# Patient Record
Sex: Female | Born: 1975 | Race: Black or African American | Hispanic: No | Marital: Married | Smoking: Never smoker
Health system: Southern US, Community
[De-identification: ages and names within clinical notes are randomized; demographics above are authoritative.]

## PROBLEM LIST (undated history)

## (undated) DIAGNOSIS — G43909 Migraine, unspecified, not intractable, without status migrainosus: Secondary | ICD-10-CM

## (undated) HISTORY — DX: Migraine, unspecified, not intractable, without status migrainosus: G43.909

## (undated) HISTORY — PX: COLONOSCOPY: SHX174

---

## 1998-05-08 ENCOUNTER — Other Ambulatory Visit: Admission: RE | Admit: 1998-05-08 | Discharge: 1998-05-08 | Payer: Self-pay | Admitting: Obstetrics & Gynecology

## 1999-02-19 ENCOUNTER — Emergency Department (HOSPITAL_COMMUNITY): Admission: EM | Admit: 1999-02-19 | Discharge: 1999-02-19 | Payer: Self-pay | Admitting: Emergency Medicine

## 1999-02-20 ENCOUNTER — Encounter: Payer: Self-pay | Admitting: Emergency Medicine

## 1999-08-01 ENCOUNTER — Encounter: Payer: Self-pay | Admitting: Internal Medicine

## 1999-08-01 ENCOUNTER — Ambulatory Visit (HOSPITAL_COMMUNITY): Admission: RE | Admit: 1999-08-01 | Discharge: 1999-08-01 | Payer: Self-pay | Admitting: Internal Medicine

## 1999-09-14 ENCOUNTER — Other Ambulatory Visit: Admission: RE | Admit: 1999-09-14 | Discharge: 1999-09-14 | Payer: Self-pay | Admitting: Obstetrics & Gynecology

## 2000-09-29 ENCOUNTER — Other Ambulatory Visit: Admission: RE | Admit: 2000-09-29 | Discharge: 2000-09-29 | Payer: Self-pay | Admitting: Obstetrics & Gynecology

## 2001-12-25 ENCOUNTER — Other Ambulatory Visit: Admission: RE | Admit: 2001-12-25 | Discharge: 2001-12-25 | Payer: Self-pay | Admitting: Obstetrics & Gynecology

## 2003-01-14 ENCOUNTER — Other Ambulatory Visit: Admission: RE | Admit: 2003-01-14 | Discharge: 2003-01-14 | Payer: Self-pay | Admitting: Obstetrics & Gynecology

## 2004-01-16 ENCOUNTER — Other Ambulatory Visit: Admission: RE | Admit: 2004-01-16 | Discharge: 2004-01-16 | Payer: Self-pay | Admitting: Obstetrics & Gynecology

## 2004-04-21 ENCOUNTER — Ambulatory Visit: Payer: Self-pay | Admitting: Internal Medicine

## 2004-04-27 ENCOUNTER — Ambulatory Visit: Payer: Self-pay | Admitting: Internal Medicine

## 2004-06-09 ENCOUNTER — Inpatient Hospital Stay (HOSPITAL_COMMUNITY): Admission: AD | Admit: 2004-06-09 | Discharge: 2004-06-09 | Payer: Self-pay | Admitting: Obstetrics and Gynecology

## 2004-06-12 ENCOUNTER — Inpatient Hospital Stay (HOSPITAL_COMMUNITY): Admission: AD | Admit: 2004-06-12 | Discharge: 2004-06-12 | Payer: Self-pay | Admitting: Obstetrics & Gynecology

## 2004-06-17 ENCOUNTER — Inpatient Hospital Stay (HOSPITAL_COMMUNITY): Admission: AD | Admit: 2004-06-17 | Discharge: 2004-06-17 | Payer: Self-pay | Admitting: Obstetrics & Gynecology

## 2004-08-24 ENCOUNTER — Ambulatory Visit: Payer: Self-pay | Admitting: Internal Medicine

## 2004-08-31 ENCOUNTER — Ambulatory Visit: Payer: Self-pay | Admitting: Internal Medicine

## 2005-07-14 ENCOUNTER — Inpatient Hospital Stay (HOSPITAL_COMMUNITY): Admission: AD | Admit: 2005-07-14 | Discharge: 2005-07-17 | Payer: Self-pay | Admitting: Obstetrics & Gynecology

## 2006-06-22 ENCOUNTER — Ambulatory Visit: Payer: Self-pay | Admitting: Endocrinology

## 2007-08-10 ENCOUNTER — Ambulatory Visit: Payer: Self-pay | Admitting: Internal Medicine

## 2007-08-10 LAB — CONVERTED CEMR LAB
ALT: 7 units/L (ref 0–35)
AST: 18 units/L (ref 0–37)
Albumin: 4 g/dL (ref 3.5–5.2)
Alkaline Phosphatase: 56 units/L (ref 39–117)
BUN: 13 mg/dL (ref 6–23)
Basophils Absolute: 0.1 10*3/uL (ref 0.0–0.1)
Basophils Relative: 0.9 % (ref 0.0–3.0)
Bilirubin Urine: NEGATIVE
Bilirubin, Direct: 0.2 mg/dL (ref 0.0–0.3)
CO2: 28 meq/L (ref 19–32)
Calcium: 9.3 mg/dL (ref 8.4–10.5)
Chloride: 106 meq/L (ref 96–112)
Cholesterol: 138 mg/dL (ref 0–200)
Creatinine, Ser: 0.8 mg/dL (ref 0.4–1.2)
Eosinophils Absolute: 0.2 10*3/uL (ref 0.0–0.7)
Eosinophils Relative: 2.5 % (ref 0.0–5.0)
GFR calc Af Amer: 106 mL/min
GFR calc non Af Amer: 88 mL/min
Glucose, Bld: 86 mg/dL (ref 70–99)
HCT: 38.6 % (ref 36.0–46.0)
HDL: 39.6 mg/dL (ref >39.0)
Hemoglobin: 13.5 g/dL (ref 12.0–15.0)
Ketones, ur: NEGATIVE mg/dL
LDL Cholesterol: 94 mg/dL (ref 0–99)
Leukocytes, UA: NEGATIVE
Lymphocytes Relative: 29.9 % (ref 12.0–46.0)
MCHC: 34.9 g/dL (ref 30.0–36.0)
MCV: 96 fL (ref 78.0–100.0)
Monocytes Absolute: 0.5 10*3/uL (ref 0.1–1.0)
Monocytes Relative: 7.3 % (ref 3.0–12.0)
Neutro Abs: 3.8 10*3/uL (ref 1.4–7.7)
Neutrophils Relative %: 59.4 % (ref 43.0–77.0)
Nitrite: NEGATIVE
Platelets: 187 10*3/uL (ref 150–400)
Potassium: 4.1 meq/L (ref 3.5–5.1)
RBC: 4.02 M/uL (ref 3.87–5.11)
RDW: 12.2 % (ref 11.5–14.6)
Sodium: 138 meq/L (ref 135–145)
Specific Gravity, Urine: 1.01 (ref 1.000–1.03)
TSH: 1.24 microintl units/mL (ref 0.35–5.50)
Total Bilirubin: 0.9 mg/dL (ref 0.3–1.2)
Total CHOL/HDL Ratio: 3.5
Total Protein, Urine: NEGATIVE mg/dL
Total Protein: 6.8 g/dL (ref 6.0–8.3)
Triglycerides: 22 mg/dL (ref 0–149)
Urine Glucose: NEGATIVE mg/dL
Urobilinogen, UA: 0.2 (ref 0.0–1.0)
VLDL: 4 mg/dL (ref 0–40)
WBC: 6.6 10*3/uL (ref 4.5–10.5)
pH: 7 (ref 5.0–8.0)

## 2007-08-15 ENCOUNTER — Ambulatory Visit: Payer: Self-pay | Admitting: Internal Medicine

## 2007-12-03 ENCOUNTER — Ambulatory Visit: Payer: Self-pay | Admitting: Internal Medicine

## 2007-12-03 DIAGNOSIS — J209 Acute bronchitis, unspecified: Secondary | ICD-10-CM | POA: Insufficient documentation

## 2007-12-15 ENCOUNTER — Ambulatory Visit: Payer: Self-pay | Admitting: Family Medicine

## 2008-05-15 ENCOUNTER — Ambulatory Visit: Payer: Self-pay | Admitting: Endocrinology

## 2008-05-15 DIAGNOSIS — J069 Acute upper respiratory infection, unspecified: Secondary | ICD-10-CM | POA: Insufficient documentation

## 2010-01-21 ENCOUNTER — Ambulatory Visit
Admission: RE | Admit: 2010-01-21 | Discharge: 2010-01-21 | Payer: Self-pay | Source: Home / Self Care | Attending: Internal Medicine | Admitting: Internal Medicine

## 2010-01-21 ENCOUNTER — Other Ambulatory Visit: Payer: Self-pay | Admitting: Internal Medicine

## 2010-01-21 LAB — BASIC METABOLIC PANEL
BUN: 12 mg/dL (ref 6–23)
CO2: 26 mEq/L (ref 19–32)
Calcium: 8.9 mg/dL (ref 8.4–10.5)
Chloride: 104 mEq/L (ref 96–112)
Creatinine, Ser: 0.7 mg/dL (ref 0.4–1.2)
GFR: 132.59 mL/min (ref >60.00)
Glucose, Bld: 79 mg/dL (ref 70–99)
Potassium: 4.1 mEq/L (ref 3.5–5.1)
Sodium: 136 mEq/L (ref 135–145)

## 2010-01-21 LAB — CBC WITH DIFFERENTIAL/PLATELET
Basophils Absolute: 0 10*3/uL (ref 0.0–0.1)
Basophils Relative: 0.3 % (ref 0.0–3.0)
Eosinophils Absolute: 0.1 10*3/uL (ref 0.0–0.7)
Eosinophils Relative: 2.1 % (ref 0.0–5.0)
HCT: 37.6 % (ref 36.0–46.0)
Hemoglobin: 13 g/dL (ref 12.0–15.0)
Lymphocytes Relative: 24.1 % (ref 12.0–46.0)
Lymphs Abs: 1.4 10*3/uL (ref 0.7–4.0)
MCHC: 34.6 g/dL (ref 30.0–36.0)
MCV: 95.4 fl (ref 78.0–100.0)
Monocytes Absolute: 0.5 10*3/uL (ref 0.1–1.0)
Monocytes Relative: 8.1 % (ref 3.0–12.0)
Neutro Abs: 3.8 10*3/uL (ref 1.4–7.7)
Neutrophils Relative %: 65.4 % (ref 43.0–77.0)
Platelets: 183 10*3/uL (ref 150.0–400.0)
RBC: 3.94 Mil/uL (ref 3.87–5.11)
RDW: 13.3 % (ref 11.5–14.6)
WBC: 5.8 10*3/uL (ref 4.5–10.5)

## 2010-01-21 LAB — URINALYSIS, ROUTINE W REFLEX MICROSCOPIC
Bilirubin Urine: NEGATIVE
Ketones, ur: NEGATIVE
Leukocytes, UA: NEGATIVE
Nitrite: NEGATIVE
Specific Gravity, Urine: >=1.03 (ref 1.000–1.030)
Total Protein, Urine: NEGATIVE
Urine Glucose: NEGATIVE
Urobilinogen, UA: 1 (ref 0.0–1.0)
pH: 6 (ref 5.0–8.0)

## 2010-01-21 LAB — HEPATIC FUNCTION PANEL
ALT: 8 U/L (ref 0–35)
AST: 17 U/L (ref 0–37)
Albumin: 3.7 g/dL (ref 3.5–5.2)
Alkaline Phosphatase: 58 U/L (ref 39–117)
Bilirubin, Direct: 0.1 mg/dL (ref 0.0–0.3)
Total Bilirubin: 0.9 mg/dL (ref 0.3–1.2)
Total Protein: 6.9 g/dL (ref 6.0–8.3)

## 2010-01-21 LAB — LIPID PANEL
Cholesterol: 146 mg/dL (ref 0–200)
HDL: 48.4 mg/dL (ref >39.00)
LDL Cholesterol: 92 mg/dL (ref 0–99)
Total CHOL/HDL Ratio: 3
Triglycerides: 26 mg/dL (ref 0.0–149.0)
VLDL: 5.2 mg/dL (ref 0.0–40.0)

## 2010-01-21 LAB — TSH: TSH: 1.45 u[IU]/mL (ref 0.35–5.50)

## 2010-01-29 ENCOUNTER — Ambulatory Visit
Admission: RE | Admit: 2010-01-29 | Discharge: 2010-01-29 | Payer: Self-pay | Source: Home / Self Care | Attending: Internal Medicine | Admitting: Internal Medicine

## 2010-02-11 NOTE — Assessment & Plan Note (Signed)
Summary: PER PT PHYSICAL--[facility]   Vital Signs:  Patient profile:   35 year old female Height:      60 inches Weight:      142 pounds BMI:     27.83 Temp:     98.7 degrees F oral Pulse rate:   84 / minute Pulse rhythm:   regular Resp:     16 per minute BP sitting:   104 / 70  (left arm) Cuff size:   regular  Vitals Entered By: Lanier Prude, CMA(AAMA) (January 29, 2010 2:23 PM) CC: CPX Is Patient Diabetic? No   CC:  CPX.  History of Present Illness: The patient presents for a preventive health examination   Current Medications (verified): 1)  Camila 0.35 Mg  Tabs (Norethindrone (Contraceptive)) .... As Dirr.  Allergies (verified): 1)  ! 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  The patient denies anorexia, fever, weight loss, vision loss, decreased hearing, hoarseness, chest pain, syncope, dyspnea on exertion, peripheral edema, prolonged cough, headaches, hemoptysis, abdominal pain, melena, hematochezia, severe indigestion/heartburn, hematuria, incontinence, genital sores, muscle weakness, suspicious skin lesions, transient blindness, difficulty walking, depression, unusual weight change, abnormal bleeding, enlarged lymph nodes, angioedema, and breast masses.    Physical Exam  General:  Well developed, well nourished, in no acute distress.  Head:  Normocephalic and atraumatic without obvious abnormalities. No apparent alopecia or balding. Eyes:  No corneal or conjunctival inflammation  noted. EOMI. Perrla.  Ears:  External ear exam shows no significant lesions or deformities.  Otoscopic examination reveals clear canals, tympanic membranes are intact bilaterally without bulging, retraction, inflammation or discharge. Hearing is grossly normal bilaterally. Nose:  External nasal examination shows no deformity or inflammation. Nasal mucosa are pink and moist without lesions or exudates. Mouth:  Oral mucosa and oropharynx without lesions or exudates.  Teeth in good repair. Min clear PND. Neck:  No deformities, masses, or tenderness noted. Lungs:  Normal respiratory effort, chest expands symmetrically. Lungs are clear to auscultation, no crackles or wheezes. Heart:  Normal rate and regular rhythm. S1 and S2 normal without gallop, murmur, click, rub or other extra sounds. Abdomen:  Bowel sounds positive,abdomen soft and non-tender without masses, organomegaly or hernias noted. Msk:  No deformity or scoliosis noted of thoracic or lumbar spine.   Extremities:  No clubbing, cyanosis, edema, or deformity noted with normal full range of motion of all joints.   Neurologic:  No cranial nerve deficits noted. Station and gait are normal. Plantar reflexes are down-going bilaterally. DTRs are symmetrical throughout. Sensory, motor and coordinative functions appear intact. Skin:  Intact without suspicious lesions or rashes Cervical Nodes:  No lymphadenopathy noted Inguinal Nodes:  No significant adenopathy Psych:  Cognition and judgment appear intact. Alert and cooperative with normal attention span and concentration. No apparent delusions, illusions, hallucinations   Impression & Recommendations:  Problem # 1:  WELL ADULT EXAM (ICD-V70.0) Assessment New The labs were reviewed with the patient.  Health and age related issues were discussed. Available screening tests and vaccinations were discussed as well. Healthy life style including good  diet and exercise was discussed.  GYN q 12 months    Complete Medication List: 1)  Camila 0.35 Mg Tabs (Norethindrone (contraceptive)) .... As dirr. 2)  Vitamin D 1000 Unit Tabs (Cholecalciferol) .Marland Kitchen.. 1 by mouth qd  Other Orders: Tdap => 67yrs IM (10272) Admin 1st Vaccine (53664)  Patient Instructions: 1)  Please schedule a follow-up appointment in 1 year well w/labs.   Orders Added: 1)  Tdap => 56yrs IM [90715] 2)  Admin 1st Vaccine [90471] 3)  Est. Patient age 41-39 [86]   Immunizations Administered:  Tetanus Vaccine:    Vaccine Type: Tdap    Site: left deltoid    Mfr: GlaxoSmithKline    Dose: 0.5 ml    Route: IM    Given by: Lanier Prude, CMA(AAMA)    Exp. Date: 10/30/2011    Lot #: [id number]    VIS given: 11/28/07 version given January 29, 2010.   Immunizations Administered:  Tetanus Vaccine:    Vaccine Type: Tdap    Site: left deltoid    Mfr: GlaxoSmithKline    Dose: 0.5 ml    Route: IM    Given by: Lanier Prude, CMA(AAMA)    Exp. Date: 10/30/2011    Lot #: [id number]    VIS given: 11/28/07 version given January 29, 2010.

## 2010-05-12 ENCOUNTER — Ambulatory Visit (INDEPENDENT_AMBULATORY_CARE_PROVIDER_SITE_OTHER): Payer: BC Managed Care – PPO | Admitting: Internal Medicine

## 2010-05-12 ENCOUNTER — Other Ambulatory Visit: Payer: Self-pay | Admitting: Internal Medicine

## 2010-05-12 ENCOUNTER — Other Ambulatory Visit (INDEPENDENT_AMBULATORY_CARE_PROVIDER_SITE_OTHER): Payer: BC Managed Care – PPO

## 2010-05-12 ENCOUNTER — Encounter: Payer: Self-pay | Admitting: Internal Medicine

## 2010-05-12 VITALS — BP 104/64 | HR 65 | Temp 98.6°F | Resp 16 | Wt 146.8 lb

## 2010-05-12 DIAGNOSIS — N390 Urinary tract infection, site not specified: Secondary | ICD-10-CM

## 2010-05-12 DIAGNOSIS — R109 Unspecified abdominal pain: Secondary | ICD-10-CM

## 2010-05-12 DIAGNOSIS — R3129 Other microscopic hematuria: Secondary | ICD-10-CM | POA: Insufficient documentation

## 2010-05-12 DIAGNOSIS — N200 Calculus of kidney: Secondary | ICD-10-CM

## 2010-05-12 LAB — CBC WITH DIFFERENTIAL/PLATELET
Basophils Relative: 0.2 % (ref 0.0–3.0)
Eosinophils Absolute: 0.2 10*3/uL (ref 0.0–0.7)
Eosinophils Relative: 2 % (ref 0.0–5.0)
HCT: 40.1 % (ref 36.0–46.0)
Lymphs Abs: 2.1 10*3/uL (ref 0.7–4.0)
MCHC: 34.8 g/dL (ref 30.0–36.0)
MCV: 95.4 fl (ref 78.0–100.0)
Monocytes Absolute: 0.2 10*3/uL (ref 0.1–1.0)
RBC: 4.2 Mil/uL (ref 3.87–5.11)
WBC: 8.6 10*3/uL (ref 4.5–10.5)

## 2010-05-12 LAB — COMPREHENSIVE METABOLIC PANEL
AST: 20 U/L (ref 0–37)
Alkaline Phosphatase: 66 U/L (ref 39–117)
BUN: 14 mg/dL (ref 6–23)
Creatinine, Ser: 0.7 mg/dL (ref 0.4–1.2)
Glucose, Bld: 81 mg/dL (ref 70–99)
Total Bilirubin: 0.5 mg/dL (ref 0.3–1.2)

## 2010-05-12 LAB — PREGNANCY SERUM, QUANT: hCG, Beta Chain, Quant, S: 0.17 m[IU]/mL

## 2010-05-12 LAB — POCT URINALYSIS DIPSTICK
Glucose, UA: NEGATIVE
Ketones, UA: NEGATIVE
Protein, UA: NEGATIVE
Spec Grav, UA: 1.005

## 2010-05-12 MED ORDER — NITROFURANTOIN MONOHYD MACRO 100 MG PO CAPS: 100.0000 mg | ORAL_CAPSULE | Freq: Two times a day (BID) | ORAL | Status: AC

## 2010-05-12 NOTE — Progress Notes (Signed)
Subjective:    Patient ID: Leslie Beard, female    DOB: 1975-03-08, 35 y.o.   MRN: [id number]  Flank Pain This is a new problem. The current episode started yesterday. The problem occurs intermittently. The problem has been gradually improving since onset. The quality of the pain is described as aching. The pain does not radiate. The pain is at a severity of 2/10. The pain is mild. The pain is the same all the time. Associated symptoms include abdominal pain and dysuria. Pertinent negatives include no bladder incontinence, bowel incontinence, chest pain, fever, headaches, leg pain, numbness, paresis, paresthesias, pelvic pain, perianal numbness, tingling, weakness or weight loss. She has tried NSAIDs for the symptoms. The treatment provided significant relief.      Review of Systems  Constitutional: Negative for fever, chills, weight loss, diaphoresis, activity change, appetite change, fatigue and unexpected weight change.  HENT: Negative for facial swelling, neck pain and neck stiffness.   Respiratory: Negative for apnea, cough, choking, chest tightness, shortness of breath, wheezing and stridor.   Cardiovascular: Negative for chest pain, palpitations and leg swelling.  Gastrointestinal: Positive for abdominal pain. Negative for nausea, vomiting, diarrhea, constipation, blood in stool, abdominal distention, anal bleeding and bowel incontinence.  Genitourinary: Positive for dysuria and flank pain. Negative for bladder incontinence, urgency, frequency, hematuria, decreased urine volume, vaginal bleeding, vaginal discharge, enuresis, difficulty urinating, genital sores, vaginal pain, menstrual problem, pelvic pain and dyspareunia.  Musculoskeletal: Negative for myalgias, back pain, joint swelling, arthralgias and gait problem.  Skin: Negative for color change, pallor and rash.  Neurological: Negative for dizziness, tingling, tremors, seizures, syncope, facial asymmetry, speech difficulty,  weakness, light-headedness, numbness, headaches and paresthesias.  Hematological: Negative for adenopathy. Does not bruise/bleed easily.  Psychiatric/Behavioral: Negative for suicidal ideas, hallucinations, behavioral problems, confusion, sleep disturbance, self-injury, dysphoric mood, decreased concentration and agitation. The patient is not nervous/anxious and is not hyperactive.        Objective:   Physical Exam  [vitalsreviewed. Constitutional: She is oriented to person, place, and time. She appears well-developed and well-nourished. No distress.  HENT:  Head: Normocephalic and atraumatic.  Right Ear: External ear normal.  Left Ear: External ear normal.  Nose: Nose normal.  Mouth/Throat: Oropharynx is clear and moist. No oropharyngeal exudate.  Eyes: Conjunctivae and EOM are normal. Pupils are equal, round, and reactive to light. Right eye exhibits no discharge. Left eye exhibits no discharge. No scleral icterus.  Neck: Normal range of motion. Neck supple. No JVD present. No tracheal deviation present. No thyromegaly present.  Cardiovascular: Normal rate, regular rhythm, normal heart sounds and intact distal pulses.  Exam reveals no gallop and no friction rub.   No murmur heard. Pulmonary/Chest: Effort normal and breath sounds normal. No stridor. No respiratory distress. She has no wheezes. She has no rales. She exhibits no tenderness.  Abdominal: Soft. Bowel sounds are normal. She exhibits no shifting dullness, no distension, no pulsatile liver, no fluid wave, no abdominal bruit, no ascites, no pulsatile midline mass and no mass. There is no hepatosplenomegaly, splenomegaly or hepatomegaly. There is no tenderness. There is CVA tenderness. There is no rigidity, no rebound, no guarding, no tenderness at McBurney's point and negative Murphy's sign. No hernia. Hernia confirmed negative in the ventral area, confirmed negative in the right inguinal area and confirmed negative in the left inguinal  area.  Musculoskeletal: Normal range of motion. She exhibits no edema and no tenderness.  Lymphadenopathy:    She has no cervical adenopathy.  Neurological: She  is alert and oriented to person, place, and time. She has normal reflexes. She displays normal reflexes. No cranial nerve deficit. She exhibits normal muscle tone. Coordination normal.  Skin: Skin is warm and dry. No rash noted. She is not diaphoretic. No erythema. No pallor.  Psychiatric: She has a normal mood and affect. Her behavior is normal. Judgment and thought content normal.       Lab Results  Component Value Date   WBC 8.6 05/12/2010   HGB 13.9 05/12/2010   HCT 40.1 05/12/2010   PLT 217.0 05/12/2010   CHOL 146 01/21/2010   TRIG 26.0 01/21/2010   HDL 48.40 01/21/2010   ALT 9 05/12/2010   AST 20 05/12/2010   NA 140 05/12/2010   K 3.4* 05/12/2010   CL 102 05/12/2010   CREATININE 0.7 05/12/2010   BUN 14 05/12/2010   CO2 30 05/12/2010   TSH 1.45 01/21/2010     Assessment & Plan:

## 2010-05-12 NOTE — Assessment & Plan Note (Signed)
She has pain and blood so I am concerned she has a stone, she does not want anything for pain

## 2010-05-12 NOTE — Patient Instructions (Signed)
Urinary Tract Infection (UTI) Infections of the urinary tract can start in several places. A bladder infection (cystitis), a kidney infection (pyelonephritis), and a prostate infection (prostatitis) are different types of urinary tract infections. They usually get better if treated with medicines (antibiotics) that kill germs. Take all the medicine until it is gone. You or your child may feel better in a few days, but TAKE ALL MEDICINE or the infection may not respond and may become more difficult to treat. HOME CARE INSTRUCTIONS  Drink enough water and fluids to keep the urine clear or pale yellow. Cranberry juice is especially recommended, in addition to large amounts of water.   Avoid caffeine, tea, and carbonated beverages. They tend to irritate the bladder.   Alcohol may irritate the prostate.   Only take over-the-counter or prescription medicines for pain, discomfort, or fever as directed by your caregiver.  FINDING OUT THE RESULTS OF YOUR TEST Not all test results are available during your visit. If your or your child's test results are not back during the visit, make an appointment with your caregiver to find out the results. Do not assume everything is normal if you have not heard from your caregiver or the medical facility. It is important for you to follow up on all test results. TO PREVENT FURTHER INFECTIONS:  Empty the bladder often. Avoid holding urine for long periods of time.   After a bowel movement, women should cleanse from front to back. Use each tissue only once.   Empty the bladder before and after sexual intercourse.  SEEK MEDICAL CARE IF:  There is back pain.   You or your child has an oral temperature above 100.5.   Your baby is older than 3 months with a rectal temperature of 100.5 F (38.1 C) or higher for more than 1 day.   Your or your child's problems (symptoms) are no better in 3 days. Return sooner if you or your child is getting worse.  SEEK IMMEDIATE  MEDICAL CARE IF:  There is severe back pain or lower abdominal pain.   You or your child develops chills.   You or your child has an oral temperature above 100.5, not controlled by medicine.   Your baby is older than 3 months with a rectal temperature of 102 F (38.9 C) or higher.   Your baby is 3 months old or younger with a rectal temperature of 100.4 F (38 C) or higher.   There is nausea or vomiting.   There is continued burning or discomfort with urination.  MAKE SURE YOU:  Understand these instructions.   Will watch this condition.   Will get help right away if you or your child is not doing well or gets worse.  Document Released: 10/06/2004 Document Re-Released: 03/23/2009 [employer] Patient Information 2011 [employer]. 

## 2010-05-12 NOTE — Assessment & Plan Note (Signed)
Start antibiotics and check the urine culture

## 2010-05-12 NOTE — Assessment & Plan Note (Signed)
Will check for UTI with a urine culture and check for a stone with a CT with contrast

## 2010-05-14 ENCOUNTER — Telehealth: Payer: Self-pay | Admitting: *Deleted

## 2010-05-14 ENCOUNTER — Encounter: Payer: Self-pay | Admitting: Internal Medicine

## 2010-05-14 NOTE — Telephone Encounter (Signed)
Pt called inquiring about recent lab results Dr. Yetta Barre ordered. I advised her of these results and that a letter was mailed today and to have these rechecked in 2 weeks.   She still c/o back pain. I advised her Dr. Yetta Barre and Plotnikov are out of office until Monday and to use OTC meds and go to UC or sat clinic if symptoms worsen during the weekend.  Please advise any different meds for pain?

## 2010-05-15 LAB — CULTURE, URINE COMPREHENSIVE: Colony Count: 15000

## 2010-05-17 MED ORDER — IBUPROFEN 600 MG PO TABS: ORAL_TABLET | ORAL | Status: AC

## 2010-05-17 NOTE — Telephone Encounter (Signed)
Try Ibuprofen OV w/me or Dr Yetta Barre pls

## 2010-05-18 NOTE — Telephone Encounter (Signed)
Pt informed

## 2010-05-21 ENCOUNTER — Ambulatory Visit (INDEPENDENT_AMBULATORY_CARE_PROVIDER_SITE_OTHER)
Admission: RE | Admit: 2010-05-21 | Discharge: 2010-05-21 | Disposition: A | Discharge status: Home / Self Care | Payer: BC Managed Care – PPO | Source: Ambulatory Visit | Attending: Internal Medicine | Admitting: Internal Medicine

## 2010-05-21 DIAGNOSIS — R109 Unspecified abdominal pain: Secondary | ICD-10-CM

## 2010-05-21 DIAGNOSIS — R3129 Other microscopic hematuria: Secondary | ICD-10-CM

## 2010-05-21 DIAGNOSIS — N200 Calculus of kidney: Secondary | ICD-10-CM

## 2010-05-25 ENCOUNTER — Telehealth: Payer: Self-pay | Admitting: *Deleted

## 2010-05-25 NOTE — Telephone Encounter (Signed)
CT was nl - good news! Thx

## 2010-05-25 NOTE — Telephone Encounter (Signed)
Patient requesting results of CT from last week.

## 2010-05-26 NOTE — Telephone Encounter (Signed)
Left mess to call office back.   

## 2010-05-26 NOTE — Telephone Encounter (Signed)
Patient informed. 

## 2010-05-28 NOTE — Discharge Summary (Signed)
NAMEMarland Kitchen  Beard, Leslie NO.:  [id number]   MEDICAL RECORD NO.:  [id number]          PATIENT TYPE:  INP   LOCATION:  9144                          FACILITY:  [facility]   PHYSICIAN:  Miguel Aschoff, M.D.       DATE OF BIRTH:  01/04/1976   DATE OF ADMISSION:  07/14/2005  DATE OF DISCHARGE:  07/17/2005                                 DISCHARGE SUMMARY   FINAL DIAGNOSIS:  Intrauterine pregnancy at term, breech presentation.   PROCEDURE:  Primary low transverse cesarean section. Surgeon Dr. Ilda Mori. Assistant Dr. Carrington Clamp.   COMPLICATIONS:  None.   This 35 year old G3, P1-0-1-1, presents at term for a cesarean section  secondary to breech presentation. The patient's antepartum course up to this  point had been uncomplicated. She did have a positive group B strep culture  obtained in the office at 36 weeks. Besides this breech presentation  her  antepartum course was uncomplicated. She declined external cephalic version.  She presents on July 14, 2005 for a primary low transverse cesarean section  which was performed by Dr. Ilda Mori. She had delivery of a 5 pound 10  ounce female infant with Apgars 9 and 9. Delivery went without  complications. The patient's postoperative course was benign without any  significant fevers. She was felt ready for discharge on postoperative day  #3. She was sent home on a regular diet. Told to decrease activities, told  to continue her prenatal vitamins. Was given Tylox one every three hours as  needed for pain. Was to follow up in our office in five weeks. To call with  any increased bleeding, pain or problems.   LABORATORIES ON DISCHARGE:  The patient had a hemoglobin 10.4, white blood  cell count 10.7, platelets 151,000.      Leilani Able, P.A.-C.      Miguel Aschoff, M.D.  Electronically Signed    MB/MEDQ  D:  08/08/2005  T:  08/08/2005  Job:  [id number]

## 2010-05-28 NOTE — Op Note (Signed)
NAMEMarland Kitchen  CORY, RAMA NO.:  [id number]   MEDICAL RECORD NO.:  [id number]          PATIENT TYPE:  INP   LOCATION:  9144                          FACILITY:  [facility]   PHYSICIAN:  Ilda Mori, M.D.   DATE OF BIRTH:  14-Aug-1975   DATE OF PROCEDURE:  07/14/2005  DATE OF DISCHARGE:                                 OPERATIVE REPORT   PREOPERATIVE DIAGNOSIS:  Term pregnancy, breech presentation.   POSTOPERATIVE DIAGNOSIS:  Term pregnancy, breech presentation.   PROCEDURE:  Primary low transverse cesarean section.   SURGEON:  Ilda Mori, M.D.   ASSISTANT:  Carrington Clamp, M.D.   ANESTHESIA:  Epidural.   ESTIMATED BLOOD LOSS:  500 cc.   FINDINGS:  Female infant.  Apgar scores 9 and 9.  Birth weight pending at  the time of this dictation but appeared to be appropriate for gestational  age.  Clear amniotic fluid.  Normal-appearing tubes and ovaries.   SPECIMENS:  None.  Placenta was sent to labor and delivery for storage.   COMPLICATIONS:  None.   INDICATIONS:  This is a 35 year old gravida 3, para 1, AB 1, with an  estimated date of confinement of July 21, 2005, at 39 weeks, who presents  with a breech presentation.  The patient had a known breech presentation for  the last three weeks in the office.  Discussions with the patient were held  with the option of external cephalic version.  The patient elected to  proceed with cesarean section.  An ultrasound done the morning of surgery  revealed that the breech presentation had persisted.   PROCEDURE:  The patient was taken to the operating room and a spinal  anesthesia was placed.  This failed to produce adequate anesthesia and an  epidural anesthesia was placed successfully.  The abdomen was prepped and  draped in a sterile fashion and the bladder was catheterized.  A low  transverse incision was made and carried down to the fascia which was  extended transversely.  The rectus muscle was divided from the  overlying  rectus sheath and then divided in the midline.  The peritoneum was then  entered sharply and extended bluntly.  The lower segment was identified.  Incision was made and carried down to the amniotic cavity.  This incision  was extended bluntly.  The infant was then delivered in frank breech  presentation without difficulty.  Cord bloods were obtained.  The placenta  was delivered manually.  The uterus was bluntly curettaged.  The lower  segment was closed with a double  layer, the first a running interlocking Vicryl suture, the second a running  imbricating Vicryl suture.  The peritoneum and rectus muscle was then closed  in the midline.  The fascia was closed with running 0 Vicryl suture and the  skin was closed with staples.  The patient tolerated the procedure well and  left the operating room in good condition.      Ilda Mori, M.D.  Electronically Signed     RK/MEDQ  D:  07/14/2005  T:  07/14/2005  Job:  [id number] 

## 2011-01-10 ENCOUNTER — Ambulatory Visit (INDEPENDENT_AMBULATORY_CARE_PROVIDER_SITE_OTHER): Payer: 59 | Admitting: Internal Medicine

## 2011-01-10 ENCOUNTER — Encounter: Payer: Self-pay | Admitting: Internal Medicine

## 2011-01-10 VITALS — BP 104/60 | HR 72 | Temp 98.7°F | Resp 16 | Wt 141.0 lb

## 2011-01-10 DIAGNOSIS — G43909 Migraine, unspecified, not intractable, without status migrainosus: Secondary | ICD-10-CM | POA: Insufficient documentation

## 2011-01-10 DIAGNOSIS — R51 Headache: Secondary | ICD-10-CM

## 2011-01-10 MED ORDER — TRAMADOL HCL 50 MG PO TABS: 50.0000 mg | ORAL_TABLET | Freq: Two times a day (BID) | ORAL | Status: AC | PRN

## 2011-01-10 MED ORDER — RIZATRIPTAN BENZOATE 10 MG PO TBDP: 10.0000 mg | ORAL_TABLET | ORAL | Status: DC | PRN

## 2011-01-10 MED ORDER — PROMETHAZINE HCL 12.5 MG PO TABS: 12.5000 mg | ORAL_TABLET | Freq: Four times a day (QID) | ORAL | Status: AC | PRN

## 2011-01-10 MED ORDER — PREDNISONE 10 MG PO TABS: ORAL_TABLET | ORAL | Status: DC

## 2011-01-10 NOTE — Assessment & Plan Note (Addendum)
12/12  X 1 wk She had been to [facility]. She had an MRI 3 y ago See meds/orders

## 2011-01-21 ENCOUNTER — Ambulatory Visit (INDEPENDENT_AMBULATORY_CARE_PROVIDER_SITE_OTHER): Payer: 59 | Admitting: Internal Medicine

## 2011-01-21 ENCOUNTER — Encounter: Payer: Self-pay | Admitting: Internal Medicine

## 2011-01-21 VITALS — BP 120/80 | HR 80 | Temp 99.7°F | Resp 16 | Wt 139.0 lb

## 2011-01-21 DIAGNOSIS — J069 Acute upper respiratory infection, unspecified: Secondary | ICD-10-CM

## 2011-01-21 MED ORDER — PROMETHAZINE-CODEINE 6.25-10 MG/5ML PO SYRP: 5.0000 mL | ORAL_SOLUTION | ORAL | Status: AC | PRN

## 2011-01-21 MED ORDER — AZITHROMYCIN 250 MG PO TABS: ORAL_TABLET | ORAL | Status: AC

## 2011-01-21 MED ORDER — AZITHROMYCIN 250 MG PO TABS: ORAL_TABLET | ORAL | Status: DC

## 2011-01-21 NOTE — Assessment & Plan Note (Signed)
Prom-cod syr OTC meds Zpac if worse

## 2011-01-21 NOTE — Progress Notes (Signed)
  Subjective:    Patient ID: Leslie Beard, female    DOB: 08-24-1975, 36 y.o.   MRN: [id number]  HPI   HPI  C/o URI sx's x 3  days. C/o ST, cough, weakness. Not better with OTC medicines. Actually, the patient is getting worse. The patient did not sleep last night due to cough.  Review of Systems  Constitutional: Positive for fever, chills and fatigue.  HENT: Positive for congestion, rhinorrhea, sneezing and postnasal drip.   Eyes: Positive for photophobia and pain. Negative for discharge and visual disturbance.  Respiratory: Positive for cough   Gastrointestinal: Negative for vomiting, abdominal pain, diarrhea and abdominal distention.  Genitourinary: Negative for dysuria and difficulty urinating.  Skin: Negative for rash.       Review of Systems     Objective:   Physical Exam  Constitutional: She appears well-developed. No distress.  HENT:  Head: Normocephalic.  Right Ear: External ear normal.  Left Ear: External ear normal.  Nose: Nose normal.  Mouth/Throat: Oropharynx is clear and moist.       eryth mucosa  Eyes: Conjunctivae are normal. Pupils are equal, round, and reactive to light. Right eye exhibits no discharge. Left eye exhibits no discharge.  Neck: Normal range of motion. Neck supple. No JVD present. No tracheal deviation present. No thyromegaly present.  Cardiovascular: Normal rate, regular rhythm and normal heart sounds.   Pulmonary/Chest: No stridor. No respiratory distress. She has no wheezes.  Abdominal: Soft. Bowel sounds are normal. She exhibits no distension and no mass. There is no tenderness. There is no rebound and no guarding.  Musculoskeletal: She exhibits no edema and no tenderness.  Lymphadenopathy:    She has no cervical adenopathy.  Neurological: She displays normal reflexes. No cranial nerve deficit. She exhibits normal muscle tone. Coordination normal.  Skin: No rash noted. No erythema.  Psychiatric: She has a normal mood and affect. Her  behavior is normal. Judgment and thought content normal.          Assessment & Plan:

## 2011-01-21 NOTE — Patient Instructions (Signed)

## 2011-03-27 ENCOUNTER — Encounter: Payer: Self-pay | Admitting: Internal Medicine

## 2011-03-27 NOTE — Progress Notes (Signed)
  Subjective:    Patient ID: Leslie Beard, female    DOB: 15-Aug-1975, 36 y.o.   MRN: [id number]  HPI  C/o HA x 1 wk, moderate No n/v  Review of Systems  Constitutional: Negative for fever and fatigue.  Respiratory: Negative for cough.   Gastrointestinal: Negative for abdominal pain.  Neurological: Positive for headaches. Negative for dizziness and weakness.  Psychiatric/Behavioral: The patient is not nervous/anxious.        Objective:   Physical Exam  Constitutional: She appears well-developed. No distress.  HENT:  Head: Normocephalic.  Right Ear: External ear normal.  Left Ear: External ear normal.  Nose: Nose normal.  Mouth/Throat: Oropharynx is clear and moist.  Eyes: Conjunctivae are normal. Pupils are equal, round, and reactive to light. Right eye exhibits no discharge. Left eye exhibits no discharge.  Neck: Normal range of motion. Neck supple. No JVD present. No tracheal deviation present. No thyromegaly present.  Cardiovascular: Normal rate, regular rhythm and normal heart sounds.   Pulmonary/Chest: No stridor. No respiratory distress. She has no wheezes.  Abdominal: Soft. Bowel sounds are normal. She exhibits no distension and no mass. There is no tenderness. There is no rebound and no guarding.  Musculoskeletal: She exhibits no edema and no tenderness.  Lymphadenopathy:    She has no cervical adenopathy.  Neurological: She displays normal reflexes. No cranial nerve deficit. She exhibits normal muscle tone. Coordination normal.  Skin: No rash noted. No erythema.  Psychiatric: She has a normal mood and affect. Her behavior is normal. Judgment and thought content normal.          Assessment & Plan:

## 2011-06-14 ENCOUNTER — Ambulatory Visit (INDEPENDENT_AMBULATORY_CARE_PROVIDER_SITE_OTHER): Payer: 59 | Admitting: Internal Medicine

## 2011-06-14 ENCOUNTER — Encounter: Payer: Self-pay | Admitting: Internal Medicine

## 2011-06-14 VITALS — BP 100/60 | HR 80 | Temp 98.3°F | Resp 16 | Wt 142.0 lb

## 2011-06-14 DIAGNOSIS — J069 Acute upper respiratory infection, unspecified: Secondary | ICD-10-CM

## 2011-06-14 MED ORDER — AZITHROMYCIN 250 MG PO TABS: ORAL_TABLET | ORAL | Status: AC

## 2011-06-14 NOTE — Assessment & Plan Note (Signed)
Zpac To work on Fri if ok OTC meds

## 2011-06-14 NOTE — Progress Notes (Signed)
Patient ID: Leslie Beard, female   DOB: 09/01/1975, 36 y.o.   MRN: [id number]  Subjective:    Patient ID: Leslie Beard, female    DOB: 1975-04-13, 36 y.o.   MRN: [id number]  Cough  Sore Throat  Associated symptoms include coughing.     HPI  C/o URI sx's x 1 wk. C/o ST, cough, weakness. Not better with OTC medicines. Actually, the patient is getting worse. The patient did not sleep last night due to cough.  Review of Systems  Constitutional: Positive for fever, chills and fatigue.  HENT: Positive for congestion, rhinorrhea, sneezing and postnasal drip.   Eyes: Positive for photophobia and pain. Negative for discharge and visual disturbance.  Respiratory: Positive for cough   Gastrointestinal: Negative for vomiting, abdominal pain, diarrhea and abdominal distention.  Genitourinary: Negative for dysuria and difficulty urinating.  Skin: Negative for rash.       Review of Systems  Respiratory: Positive for cough.        Objective:   Physical Exam  Constitutional: She appears well-developed. No distress.  HENT:  Head: Normocephalic.  Right Ear: External ear normal.  Left Ear: External ear normal.  Nose: Nose normal.  Mouth/Throat: Oropharynx is clear and moist.       eryth mucosa  Eyes: Conjunctivae are normal. Pupils are equal, round, and reactive to light. Right eye exhibits no discharge. Left eye exhibits no discharge.  Neck: Normal range of motion. Neck supple. No JVD present. No tracheal deviation present. No thyromegaly present.  Cardiovascular: Normal rate, regular rhythm and normal heart sounds.   Pulmonary/Chest: No stridor. No respiratory distress. She has no wheezes.  Abdominal: Soft. Bowel sounds are normal. She exhibits no distension and no mass. There is no tenderness. There is no rebound and no guarding.  Musculoskeletal: She exhibits no edema and no tenderness.  Lymphadenopathy:    She has no cervical adenopathy.  Neurological: She displays normal  reflexes. No cranial nerve deficit. She exhibits normal muscle tone. Coordination normal.  Skin: No rash noted. No erythema.  Psychiatric: She has a normal mood and affect. Her behavior is normal. Judgment and thought content normal.          Assessment & Plan:

## 2011-12-13 ENCOUNTER — Encounter: Payer: Self-pay | Admitting: Internal Medicine

## 2011-12-13 DIAGNOSIS — Z0000 Encounter for general adult medical examination without abnormal findings: Secondary | ICD-10-CM

## 2012-02-01 ENCOUNTER — Encounter: Payer: 59 | Admitting: Internal Medicine

## 2012-02-07 ENCOUNTER — Encounter: Payer: Self-pay | Admitting: Internal Medicine

## 2012-02-07 ENCOUNTER — Ambulatory Visit (INDEPENDENT_AMBULATORY_CARE_PROVIDER_SITE_OTHER): Payer: 59 | Admitting: Internal Medicine

## 2012-02-07 ENCOUNTER — Ambulatory Visit (INDEPENDENT_AMBULATORY_CARE_PROVIDER_SITE_OTHER)
Admission: RE | Admit: 2012-02-07 | Discharge: 2012-02-07 | Disposition: A | Discharge status: Home / Self Care | Payer: 59 | Source: Ambulatory Visit | Attending: Internal Medicine | Admitting: Internal Medicine

## 2012-02-07 ENCOUNTER — Other Ambulatory Visit (INDEPENDENT_AMBULATORY_CARE_PROVIDER_SITE_OTHER): Payer: 59

## 2012-02-07 VITALS — BP 120/80 | HR 60 | Temp 98.5°F | Wt 151.0 lb

## 2012-02-07 DIAGNOSIS — J069 Acute upper respiratory infection, unspecified: Secondary | ICD-10-CM

## 2012-02-07 DIAGNOSIS — R0602 Shortness of breath: Secondary | ICD-10-CM

## 2012-02-07 DIAGNOSIS — R0609 Other forms of dyspnea: Secondary | ICD-10-CM

## 2012-02-07 DIAGNOSIS — J209 Acute bronchitis, unspecified: Secondary | ICD-10-CM

## 2012-02-07 LAB — BASIC METABOLIC PANEL
Chloride: 104 mEq/L (ref 96–112)
GFR: 112.89 mL/min (ref >60.00)
Glucose, Bld: 84 mg/dL (ref 70–99)
Potassium: 3.8 mEq/L (ref 3.5–5.1)
Sodium: 136 mEq/L (ref 135–145)

## 2012-02-07 LAB — CBC WITH DIFFERENTIAL/PLATELET
Eosinophils Relative: 2.2 % (ref 0.0–5.0)
Lymphocytes Relative: 26.3 % (ref 12.0–46.0)
MCV: 93.7 fl (ref 78.0–100.0)
Monocytes Absolute: 0.6 10*3/uL (ref 0.1–1.0)
Monocytes Relative: 6.6 % (ref 3.0–12.0)
Neutrophils Relative %: 64.3 % (ref 43.0–77.0)
Platelets: 218 10*3/uL (ref 150.0–400.0)
WBC: 8.4 10*3/uL (ref 4.5–10.5)

## 2012-02-07 MED ORDER — MOMETASONE FURO-FORMOTEROL FUM 100-5 MCG/ACT IN AERO: 2.0000 | INHALATION_SPRAY | Freq: Two times a day (BID) | RESPIRATORY_TRACT | Status: DC

## 2012-02-07 MED ORDER — PROMETHAZINE-CODEINE 6.25-10 MG/5ML PO SYRP
5.0000 mL | ORAL_SOLUTION | ORAL | Status: AC | PRN
Start: 2012-02-07 — End: 2012-02-17

## 2012-02-07 NOTE — Progress Notes (Signed)
   Subjective:    Shortness of Breath This is a new problem. The current episode started in the past 7 days. The problem occurs intermittently. The problem has been unchanged. Pertinent negatives include no fever or wheezing. The symptoms are aggravated by exercise. Risk factors include oral contraceptive. The treatment provided no relief. There is no history of asthma or CAD.     HPI  C/o URI sx's x 14days. C/o ST, cough, weakness. Not better with OTC medicines. Actually, the patient is getting worse. The patient did not sleep last night due to cough.  Review of Systems  Constitutional: Positive for fever, chills and fatigue.  HENT: Positive for congestion, rhinorrhea, sneezing and postnasal drip.   Eyes: Positive for photophobia and pain. Negative for discharge and visual disturbance.  Respiratory: Positive for cough   Gastrointestinal: Negative for vomiting, abdominal pain, diarrhea and abdominal distention.  Genitourinary: Negative for dysuria and difficulty urinating.  Skin: Negative for rash.       Review of Systems  Constitutional: Negative for fever.  Respiratory: Positive for shortness of breath. Negative for wheezing.        Objective:   Physical Exam  Constitutional: She appears well-developed. No distress.  HENT:  Head: Normocephalic.  Right Ear: External ear normal.  Left Ear: External ear normal.  Nose: Nose normal.  Mouth/Throat: Oropharynx is clear and moist.       eryth mucosa  Eyes: Conjunctivae normal are normal. Pupils are equal, round, and reactive to light. Right eye exhibits no discharge. Left eye exhibits no discharge.  Neck: Normal range of motion. Neck supple. No JVD present. No tracheal deviation present. No thyromegaly present.  Cardiovascular: Normal rate, regular rhythm and normal heart sounds.   Pulmonary/Chest: No stridor. No respiratory distress. She has no wheezes.  Abdominal: Soft. Bowel sounds are normal. She exhibits no distension and  no mass. There is no tenderness. There is no rebound and no guarding.  Musculoskeletal: She exhibits no edema and no tenderness.  Lymphadenopathy:    She has no cervical adenopathy.  Neurological: She displays normal reflexes. No cranial nerve deficit. She exhibits normal muscle tone. Coordination normal.  Skin: No rash noted. No erythema.  Psychiatric: She has a normal mood and affect. Her behavior is normal. Judgment and thought content normal.          Assessment & Plan:

## 2012-02-07 NOTE — Patient Instructions (Addendum)
Go to ER if very short of breath 

## 2012-02-08 ENCOUNTER — Telehealth: Payer: Self-pay | Admitting: Internal Medicine

## 2012-02-08 ENCOUNTER — Encounter: Payer: Self-pay | Admitting: Internal Medicine

## 2012-02-08 DIAGNOSIS — R0609 Other forms of dyspnea: Secondary | ICD-10-CM | POA: Insufficient documentation

## 2012-02-08 LAB — D-DIMER, QUANTITATIVE: D-Dimer, Quant: 0.27 ug/mL-FEU (ref 0.00–0.48)

## 2012-02-08 NOTE — Telephone Encounter (Signed)
Misty Stanley, please, inform patient that all labs and CXR are normal. X - as we discussed Thx

## 2012-02-08 NOTE — Assessment & Plan Note (Signed)
1/14 acute - likely due to asthmatic bronchitis CXR EKG Labs incl d-dimer [facility]

## 2012-02-08 NOTE — Assessment & Plan Note (Signed)
Prom-cod syr prn 

## 2012-02-08 NOTE — Assessment & Plan Note (Signed)
Dulera bid

## 2012-02-08 NOTE — Telephone Encounter (Signed)
Pt informed

## 2012-02-20 ENCOUNTER — Other Ambulatory Visit (INDEPENDENT_AMBULATORY_CARE_PROVIDER_SITE_OTHER): Payer: 59

## 2012-02-20 DIAGNOSIS — Z0000 Encounter for general adult medical examination without abnormal findings: Secondary | ICD-10-CM

## 2012-02-20 LAB — LIPID PANEL
LDL Cholesterol: 87 mg/dL (ref 0–99)
VLDL: 6.4 mg/dL (ref 0.0–40.0)

## 2012-02-20 LAB — URINALYSIS, ROUTINE W REFLEX MICROSCOPIC
Leukocytes, UA: NEGATIVE
Specific Gravity, Urine: 1.015 (ref 1.000–1.030)
Urine Glucose: NEGATIVE
Urobilinogen, UA: 0.2 (ref 0.0–1.0)

## 2012-02-20 LAB — COMPREHENSIVE METABOLIC PANEL
AST: 19 U/L (ref 0–37)
Albumin: 3.8 g/dL (ref 3.5–5.2)
Alkaline Phosphatase: 57 U/L (ref 39–117)
Potassium: 3.7 mEq/L (ref 3.5–5.1)
Sodium: 139 mEq/L (ref 135–145)
Total Protein: 7.2 g/dL (ref 6.0–8.3)

## 2012-02-20 LAB — CBC WITH DIFFERENTIAL/PLATELET
Basophils Relative: 1.2 % (ref 0.0–3.0)
Eosinophils Absolute: 0.1 10*3/uL (ref 0.0–0.7)
MCHC: 33.7 g/dL (ref 30.0–36.0)
MCV: 94.4 fl (ref 78.0–100.0)
Monocytes Absolute: 0.4 10*3/uL (ref 0.1–1.0)
Neutrophils Relative %: 62.6 % (ref 43.0–77.0)
Platelets: 206 10*3/uL (ref 150.0–400.0)

## 2012-02-21 ENCOUNTER — Ambulatory Visit (INDEPENDENT_AMBULATORY_CARE_PROVIDER_SITE_OTHER): Payer: 59 | Admitting: Internal Medicine

## 2012-02-21 ENCOUNTER — Encounter: Payer: Self-pay | Admitting: Internal Medicine

## 2012-02-21 VITALS — BP 118/80 | HR 80 | Temp 97.3°F | Resp 16 | Ht 61.0 in | Wt 149.0 lb

## 2012-02-21 DIAGNOSIS — R0609 Other forms of dyspnea: Secondary | ICD-10-CM

## 2012-02-21 DIAGNOSIS — G43909 Migraine, unspecified, not intractable, without status migrainosus: Secondary | ICD-10-CM

## 2012-02-21 DIAGNOSIS — Z0000 Encounter for general adult medical examination without abnormal findings: Secondary | ICD-10-CM | POA: Insufficient documentation

## 2012-02-21 MED ORDER — PREDNISONE 10 MG PO TABS: ORAL_TABLET | ORAL | Status: DC

## 2012-02-21 MED ORDER — SUMATRIPTAN SUCCINATE 100 MG PO TABS: 100.0000 mg | ORAL_TABLET | ORAL | Status: DC | PRN

## 2012-02-21 NOTE — Progress Notes (Deleted)
Subjective:     Patient ID: Leslie Beard, female   DOB: 11-May-1975, 37 y.o.   MRN: [id number]  HPI   Review of Systems     Objective:   Physical Exam     Assessment:     ***    Plan:     ***

## 2012-02-21 NOTE — Assessment & Plan Note (Signed)
We discussed age appropriate health related issues, including available/recomended screening tests and vaccinations. We discussed a need for adhering to healthy diet and exercise. Labs/EKG were reviewed/ordered. All questions were answered.   

## 2012-02-21 NOTE — Assessment & Plan Note (Signed)
Resolved on MDI

## 2012-02-21 NOTE — Progress Notes (Signed)
  Subjective:    Patient ID: Leslie Beard, female    DOB: [birth year], 37 y.o.   MRN: [id number]  HPI  The patient is here for a wellness exam. The patient has been doing well overall without major physical or psychological issues going on lately. The patient needs to address  chronic hypertension that has been well controlled with medicines; to address chronic  hyperlipidemia controlled with medicines as well; and to address type 2 chronic diabetes, controlled with medical treatment and diet.  BP Readings from Last 3 Encounters:  02/21/12 118/80  02/07/12 120/80  06/14/11 100/60   Wt Readings from Last 3 Encounters:  02/21/12 149 lb (67.586 kg)  02/07/12 151 lb (68.493 kg)  06/14/11 142 lb (64.411 kg)     Review of Systems  Constitutional: Negative for chills, activity change, appetite change, fatigue and unexpected weight change.  HENT: Negative for congestion, mouth sores and sinus pressure.   Eyes: Negative for visual disturbance.  Respiratory: Negative for cough and chest tightness.   Gastrointestinal: Negative for nausea and abdominal pain.  Genitourinary: Negative for frequency, difficulty urinating and vaginal pain.  Musculoskeletal: Negative for back pain and gait problem.  Skin: Negative for pallor and rash.  Neurological: Negative for dizziness, tremors, weakness, numbness and headaches.  Psychiatric/Behavioral: Negative for confusion and sleep disturbance.       Objective:   Physical Exam  Constitutional: She appears well-developed. No distress.  HENT:  Head: Normocephalic.  Right Ear: External ear normal.  Left Ear: External ear normal.  Nose: Nose normal.  Mouth/Throat: Oropharynx is clear and moist.  Eyes: Conjunctivae are normal. Pupils are equal, round, and reactive to light. Right eye exhibits no discharge. Left eye exhibits no discharge.  Neck: Normal range of motion. Neck supple. No JVD present. No tracheal deviation present. No thyromegaly present.   Cardiovascular: Normal rate, regular rhythm and normal heart sounds.   Pulmonary/Chest: No stridor. No respiratory distress. She has no wheezes.  Abdominal: Soft. Bowel sounds are normal. She exhibits no distension and no mass. There is no tenderness. There is no rebound and no guarding.  Musculoskeletal: She exhibits no edema and no tenderness.  Lymphadenopathy:    She has no cervical adenopathy.  Neurological: She displays normal reflexes. No cranial nerve deficit. She exhibits normal muscle tone. Coordination normal.  Skin: No rash noted. No erythema.  Psychiatric: She has a normal mood and affect. Her behavior is normal. Judgment and thought content normal.   Lab Results  Component Value Date   WBC 5.8 02/20/2012   HGB 12.7 02/20/2012   HCT 37.6 02/20/2012   PLT 206.0 02/20/2012   GLUCOSE 85 02/20/2012   CHOL 136 02/20/2012   TRIG 32.0 02/20/2012   HDL 42.20 02/20/2012   LDLCALC 87 02/20/2012   ALT 8 02/20/2012   AST 19 02/20/2012   NA 139 02/20/2012   K 3.7 02/20/2012   CL 105 02/20/2012   CREATININE 0.7 02/20/2012   BUN 12 02/20/2012   CO2 27 02/20/2012   TSH 1.14 02/20/2012          Assessment & Plan:

## 2012-02-21 NOTE — Assessment & Plan Note (Signed)
See meds 

## 2013-07-19 ENCOUNTER — Encounter: Payer: 59 | Admitting: Internal Medicine

## 2013-07-29 ENCOUNTER — Other Ambulatory Visit (INDEPENDENT_AMBULATORY_CARE_PROVIDER_SITE_OTHER): Payer: Managed Care, Other (non HMO)

## 2013-07-29 ENCOUNTER — Other Ambulatory Visit: Payer: Self-pay | Admitting: Internal Medicine

## 2013-07-29 ENCOUNTER — Ambulatory Visit (INDEPENDENT_AMBULATORY_CARE_PROVIDER_SITE_OTHER): Payer: Managed Care, Other (non HMO) | Admitting: Internal Medicine

## 2013-07-29 ENCOUNTER — Encounter: Payer: Self-pay | Admitting: Internal Medicine

## 2013-07-29 VITALS — BP 116/98 | HR 70 | Temp 98.2°F | Ht 60.0 in | Wt 152.4 lb

## 2013-07-29 DIAGNOSIS — Z0000 Encounter for general adult medical examination without abnormal findings: Secondary | ICD-10-CM

## 2013-07-29 DIAGNOSIS — G43109 Migraine with aura, not intractable, without status migrainosus: Secondary | ICD-10-CM

## 2013-07-29 DIAGNOSIS — R5383 Other fatigue: Secondary | ICD-10-CM

## 2013-07-29 DIAGNOSIS — R5381 Other malaise: Secondary | ICD-10-CM | POA: Insufficient documentation

## 2013-07-29 DIAGNOSIS — R51 Headache: Secondary | ICD-10-CM

## 2013-07-29 DIAGNOSIS — R519 Headache, unspecified: Secondary | ICD-10-CM

## 2013-07-29 DIAGNOSIS — E538 Deficiency of other specified B group vitamins: Secondary | ICD-10-CM | POA: Insufficient documentation

## 2013-07-29 LAB — BASIC METABOLIC PANEL
BUN: 10 mg/dL (ref 6–23)
CO2: 24 mEq/L (ref 19–32)
Calcium: 9.1 mg/dL (ref 8.4–10.5)
Chloride: 103 mEq/L (ref 96–112)
Creatinine, Ser: 0.8 mg/dL (ref 0.4–1.2)
GFR: 96.78 mL/min (ref >60.00)
GLUCOSE: 79 mg/dL (ref 70–99)
POTASSIUM: 3.9 meq/L (ref 3.5–5.1)
Sodium: 136 mEq/L (ref 135–145)

## 2013-07-29 LAB — TSH: TSH: 1.92 u[IU]/mL (ref 0.35–4.50)

## 2013-07-29 LAB — CBC WITH DIFFERENTIAL/PLATELET
Basophils Absolute: 0 10*3/uL (ref 0.0–0.1)
Basophils Relative: 0.4 % (ref 0.0–3.0)
EOS PCT: 1 % (ref 0.0–5.0)
Eosinophils Absolute: 0.1 10*3/uL (ref 0.0–0.7)
HCT: 39.5 % (ref 36.0–46.0)
Hemoglobin: 13.4 g/dL (ref 12.0–15.0)
LYMPHS PCT: 20.4 % (ref 12.0–46.0)
Lymphs Abs: 1.7 10*3/uL (ref 0.7–4.0)
MCHC: 33.9 g/dL (ref 30.0–36.0)
MCV: 92.6 fl (ref 78.0–100.0)
Monocytes Absolute: 0.5 10*3/uL (ref 0.1–1.0)
Monocytes Relative: 5.8 % (ref 3.0–12.0)
NEUTROS PCT: 72.4 % (ref 43.0–77.0)
Neutro Abs: 6.1 10*3/uL (ref 1.4–7.7)
Platelets: 213 10*3/uL (ref 150.0–400.0)
RBC: 4.26 Mil/uL (ref 3.87–5.11)
RDW: 13 % (ref 11.5–15.5)
WBC: 8.3 10*3/uL (ref 4.0–10.5)

## 2013-07-29 LAB — URINALYSIS, ROUTINE W REFLEX MICROSCOPIC
BILIRUBIN URINE: NEGATIVE
KETONES UR: NEGATIVE
Leukocytes, UA: NEGATIVE
NITRITE: NEGATIVE
Specific Gravity, Urine: 1.015 (ref 1.000–1.030)
TOTAL PROTEIN, URINE-UPE24: NEGATIVE
Urine Glucose: NEGATIVE
Urobilinogen, UA: 0.2 (ref 0.0–1.0)
WBC, UA: NONE SEEN (ref 0–?)
pH: 6.5 (ref 5.0–8.0)

## 2013-07-29 LAB — LIPID PANEL
CHOL/HDL RATIO: 4
Cholesterol: 190 mg/dL (ref 0–200)
HDL: 53.6 mg/dL (ref >39.00)
LDL CALC: 121 mg/dL — AB (ref 0–99)
NONHDL: 136.4
Triglycerides: 79 mg/dL (ref 0.0–149.0)
VLDL: 15.8 mg/dL (ref 0.0–40.0)

## 2013-07-29 LAB — HEPATIC FUNCTION PANEL
ALT: 10 U/L (ref 0–35)
AST: 17 U/L (ref 0–37)
Albumin: 3.7 g/dL (ref 3.5–5.2)
Alkaline Phosphatase: 45 U/L (ref 39–117)
BILIRUBIN DIRECT: 0.1 mg/dL (ref 0.0–0.3)
TOTAL PROTEIN: 7.4 g/dL (ref 6.0–8.3)
Total Bilirubin: 0.4 mg/dL (ref 0.2–1.2)

## 2013-07-29 LAB — IBC PANEL
Iron: 111 ug/dL (ref 42–145)
Saturation Ratios: 25.5 % (ref 20.0–50.0)
Transferrin: 311.5 mg/dL (ref 212.0–360.0)

## 2013-07-29 LAB — VITAMIN B12: Vitamin B-12: 170 pg/mL — ABNORMAL LOW (ref 211–911)

## 2013-07-29 MED ORDER — VITAMIN B-12 1000 MCG SL SUBL
1.0000 | SUBLINGUAL_TABLET | Freq: Every day | SUBLINGUAL | Status: DC
Start: 2013-07-29 — End: 2014-10-18

## 2013-07-29 MED ORDER — POLYETHYLENE GLYCOL 3350 17 GM/SCOOP PO POWD: 17.0000 g | Freq: Two times a day (BID) | ORAL | Status: DC | PRN

## 2013-07-29 NOTE — Assessment & Plan Note (Signed)
We discussed age appropriate health related issues, including available/recomended screening tests and vaccinations. We discussed a need for adhering to healthy diet and exercise. Labs/EKG were reviewed/ordered. All questions were answered. PAP q 12 mo Labs

## 2013-07-29 NOTE — Assessment & Plan Note (Signed)
Start SL B12 RTC 3 mo w/labs

## 2013-07-29 NOTE — Assessment & Plan Note (Signed)
Better  

## 2013-07-29 NOTE — Progress Notes (Signed)
Pre visit review using our clinic review tool, if applicable. No additional management support is needed unless otherwise documented below in the visit note. 

## 2013-07-29 NOTE — Progress Notes (Signed)
  Subjective:    HPI  The patient is here for a wellness exam. The patient has been doing well overall without major physical or psychological issues going on lately.  C/o fatigue. Dad is in Hospice. Migraines are better on a new BCP  BP Readings from Last 3 Encounters:  07/29/13 116/98  02/21/12 118/80  02/07/12 120/80   Wt Readings from Last 3 Encounters:  07/29/13 152 lb 6.4 oz (69.128 kg)  02/21/12 149 lb (67.586 kg)  02/07/12 151 lb (68.493 kg)     Review of Systems  Constitutional: Negative for chills, activity change, appetite change, fatigue and unexpected weight change.  HENT: Negative for congestion, mouth sores and sinus pressure.   Eyes: Negative for visual disturbance.  Respiratory: Negative for cough and chest tightness.   Gastrointestinal: Negative for nausea and abdominal pain.  Genitourinary: Negative for frequency, difficulty urinating and vaginal pain.  Musculoskeletal: Negative for back pain and gait problem.  Skin: Negative for pallor and rash.  Neurological: Negative for dizziness, tremors, weakness, numbness and headaches.  Psychiatric/Behavioral: Negative for confusion and sleep disturbance.       Objective:   Physical Exam  Constitutional: She appears well-developed. No distress.  HENT:  Head: Normocephalic.  Right Ear: External ear normal.  Left Ear: External ear normal.  Nose: Nose normal.  Mouth/Throat: Oropharynx is clear and moist.  Eyes: Conjunctivae are normal. Pupils are equal, round, and reactive to light. Right eye exhibits no discharge. Left eye exhibits no discharge.  Neck: Normal range of motion. Neck supple. No JVD present. No tracheal deviation present. No thyromegaly present.  Cardiovascular: Normal rate, regular rhythm and normal heart sounds.   Pulmonary/Chest: No stridor. No respiratory distress. She has no wheezes.  Abdominal: Soft. Bowel sounds are normal. She exhibits no distension and no mass. There is no tenderness.  There is no rebound and no guarding.  Musculoskeletal: She exhibits no edema and no tenderness.  Lymphadenopathy:    She has no cervical adenopathy.  Neurological: She displays normal reflexes. No cranial nerve deficit. She exhibits normal muscle tone. Coordination normal.  Skin: No rash noted. No erythema.  Psychiatric: She has a normal mood and affect. Her behavior is normal. Judgment and thought content normal.   Lab Results  Component Value Date   WBC 5.8 02/20/2012   HGB 12.7 02/20/2012   HCT 37.6 02/20/2012   PLT 206.0 02/20/2012   GLUCOSE 85 02/20/2012   CHOL 136 02/20/2012   TRIG 32.0 02/20/2012   HDL 42.20 02/20/2012   LDLCALC 87 02/20/2012   ALT 8 02/20/2012   AST 19 02/20/2012   NA 139 02/20/2012   K 3.7 02/20/2012   CL 105 02/20/2012   CREATININE 0.7 02/20/2012   BUN 12 02/20/2012   CO2 27 02/20/2012   TSH 1.14 02/20/2012          Assessment & Plan:

## 2013-07-29 NOTE — Assessment & Plan Note (Signed)
2015 multifactorial Labs

## 2013-10-04 ENCOUNTER — Encounter: Payer: Self-pay | Admitting: Internal Medicine

## 2013-10-04 ENCOUNTER — Other Ambulatory Visit: Payer: Managed Care, Other (non HMO)

## 2013-10-04 ENCOUNTER — Ambulatory Visit (INDEPENDENT_AMBULATORY_CARE_PROVIDER_SITE_OTHER)
Admission: RE | Admit: 2013-10-04 | Discharge: 2013-10-04 | Disposition: A | Discharge status: Home / Self Care | Payer: Managed Care, Other (non HMO) | Source: Ambulatory Visit | Attending: Internal Medicine | Admitting: Internal Medicine

## 2013-10-04 ENCOUNTER — Ambulatory Visit (INDEPENDENT_AMBULATORY_CARE_PROVIDER_SITE_OTHER): Payer: Managed Care, Other (non HMO) | Admitting: Internal Medicine

## 2013-10-04 VITALS — BP 110/84 | HR 66 | Temp 98.3°F | Resp 14 | Wt 155.5 lb

## 2013-10-04 DIAGNOSIS — M25579 Pain in unspecified ankle and joints of unspecified foot: Secondary | ICD-10-CM

## 2013-10-04 DIAGNOSIS — M25476 Effusion, unspecified foot: Secondary | ICD-10-CM

## 2013-10-04 DIAGNOSIS — M25473 Effusion, unspecified ankle: Secondary | ICD-10-CM

## 2013-10-04 DIAGNOSIS — M25571 Pain in right ankle and joints of right foot: Principal | ICD-10-CM

## 2013-10-04 DIAGNOSIS — M25471 Effusion, right ankle: Secondary | ICD-10-CM

## 2013-10-04 MED ORDER — TRAMADOL HCL 50 MG PO TABS: 50.0000 mg | ORAL_TABLET | Freq: Four times a day (QID) | ORAL | Status: DC | PRN

## 2013-10-04 NOTE — Patient Instructions (Addendum)
Use an anti-inflammatory cream such as Aspercreme or Zostrix cream twice a day to the affected area as needed. In lieu of this warm moist compresses or  hot water bottle can be used.Compare response to application of ice . Employ ankle wrap when up & ambulating.  Note:in reference to imaging; she just completed menses

## 2013-10-04 NOTE — Progress Notes (Signed)
   Subjective:    Patient ID: Leslie Beard, female    DOB: 08/17/1975, 38 y.o.   MRN: [id number]  HPI She noted swelling of the right lateral ankle 8 days ago and then began to have some pain. The pain has been  up to a level VI and can last hours. It is worse with ambulation.  She's used ice and heat with some benefit  She has a history of fracturing this leg at age 29. She is unsure of the level of the fracture  There was no injury or trigger for the pain and swelling.. She specifically denies hearing or feeling a pop/tear prior to the swelling and pain.    Review of Systems  She denies joint stiffness, skin color/temperature change, rash, fever, chills, or sweats.  There's also been no numbness, tingling, weakness of the leg  She denies any chest pain, palpitations, paroxysmal nocturnal dyspnea.      Objective:   Physical Exam   Positive pertinent findings include: There is visible swelling around the right lateral malleolus. She is tender to palpation above the lateral malleolus. There's trace edema in this area. With inversion of the foot there is some discomfort below the right lateral malleolus. There is no pain to compression of the foot dorsally or along either side. There is no tenderness to palpation of the Achilles tendon. Arch is slightly low. Skin appears normal. Pedal pulses are excellent. Strength, tone, deep tendon reflexes are normal.  General appearance :adequately nourished; in no distress. Eyes: No conjunctival inflammation or scleral icterus is present.  Heart:  Normal rate and regular rhythm. S1 and S2 normal without gallop, murmur, click, rub or other extra sounds   Lungs:Chest clear to auscultation; no wheezes, rhonchi,rales ,or rubs present.No increased work of breathing. Skin:Warm & dry.  Intact without suspicious lesions or rashes ; no jaundice or tenting Lymphatic: No lymphadenopathy is noted about the head, neck, axilla               Assessment & Plan:  #1 right ankle pain and swelling; clinically soft tissue etiology is suggested.  See orders and after visit summary.

## 2013-10-04 NOTE — Progress Notes (Signed)
Pre visit review using our clinic review tool, if applicable. No additional management support is needed unless otherwise documented below in the visit note. 

## 2014-01-27 IMAGING — CR DG CHEST 2V
2 series · 2 of 2 positions shown · non-contrast
Comparison: None.

CLINICAL DATA: Dyspnea with exertion; cough

CHEST - 2 VIEW

[view not recorded (1 of 2)]
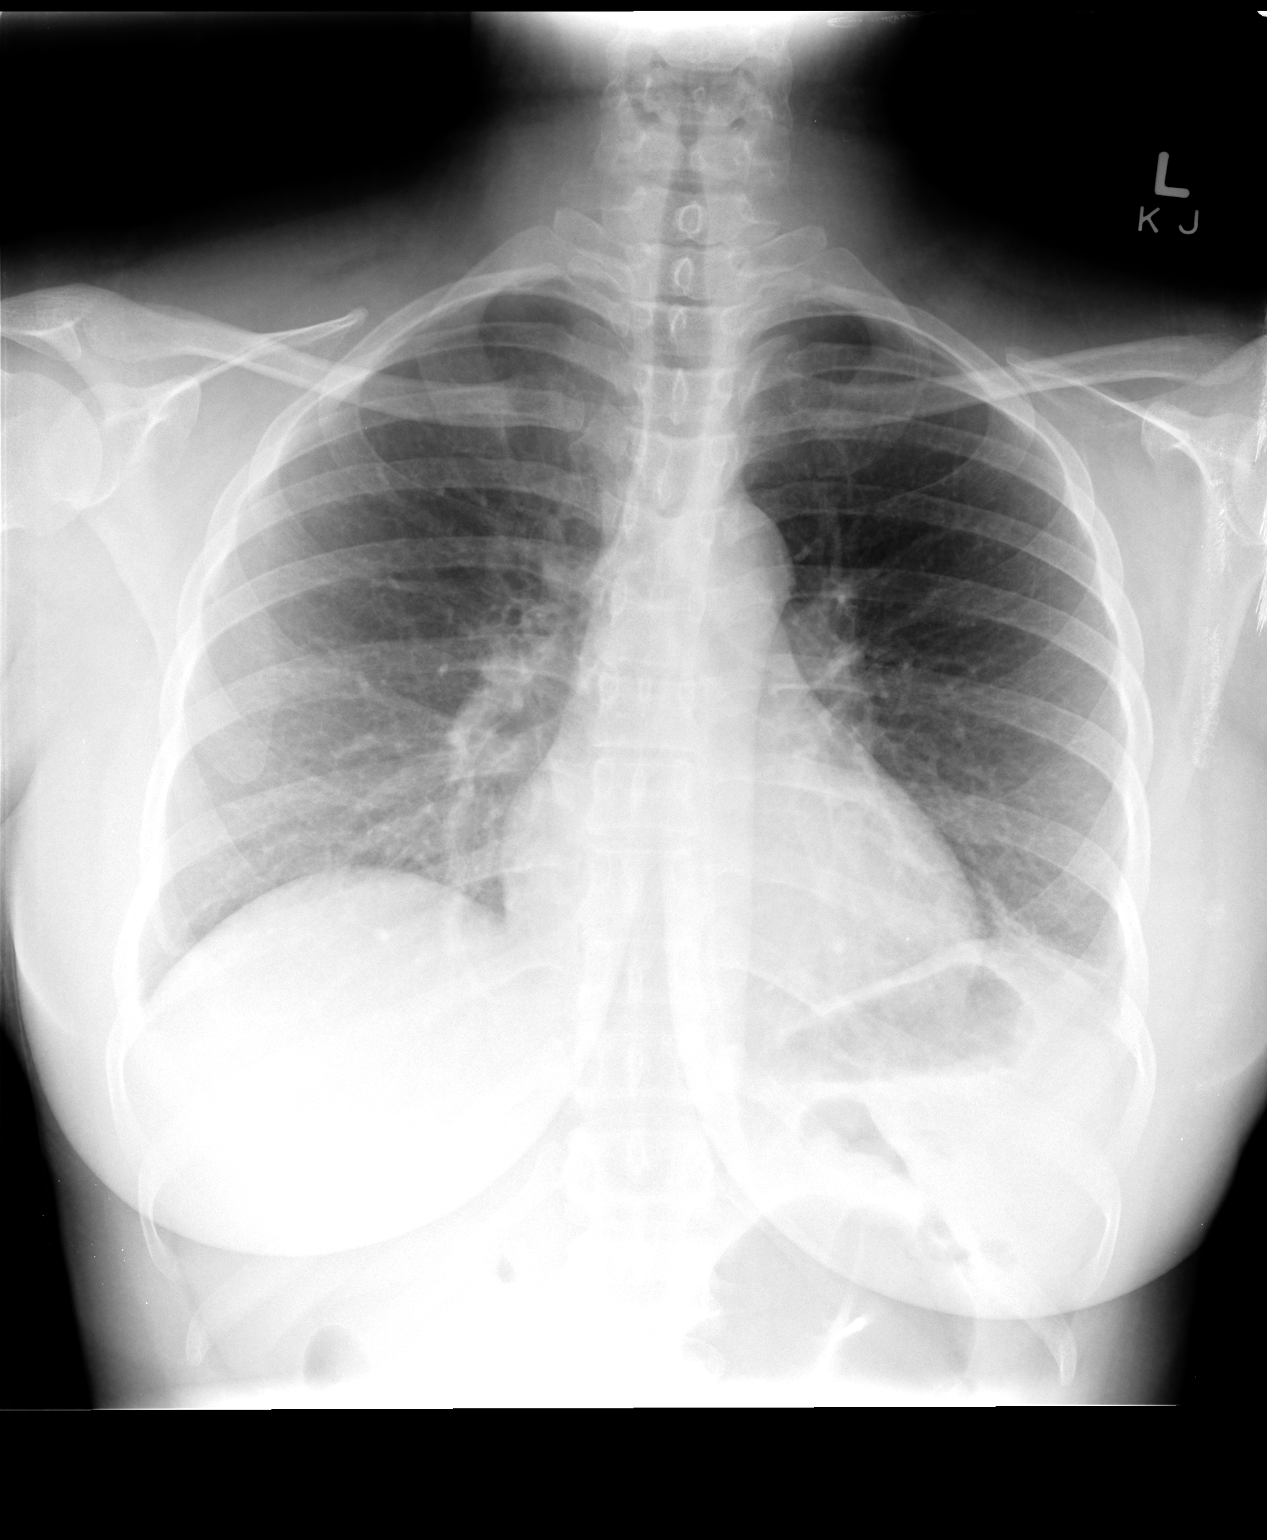

[view not recorded (2 of 2)]
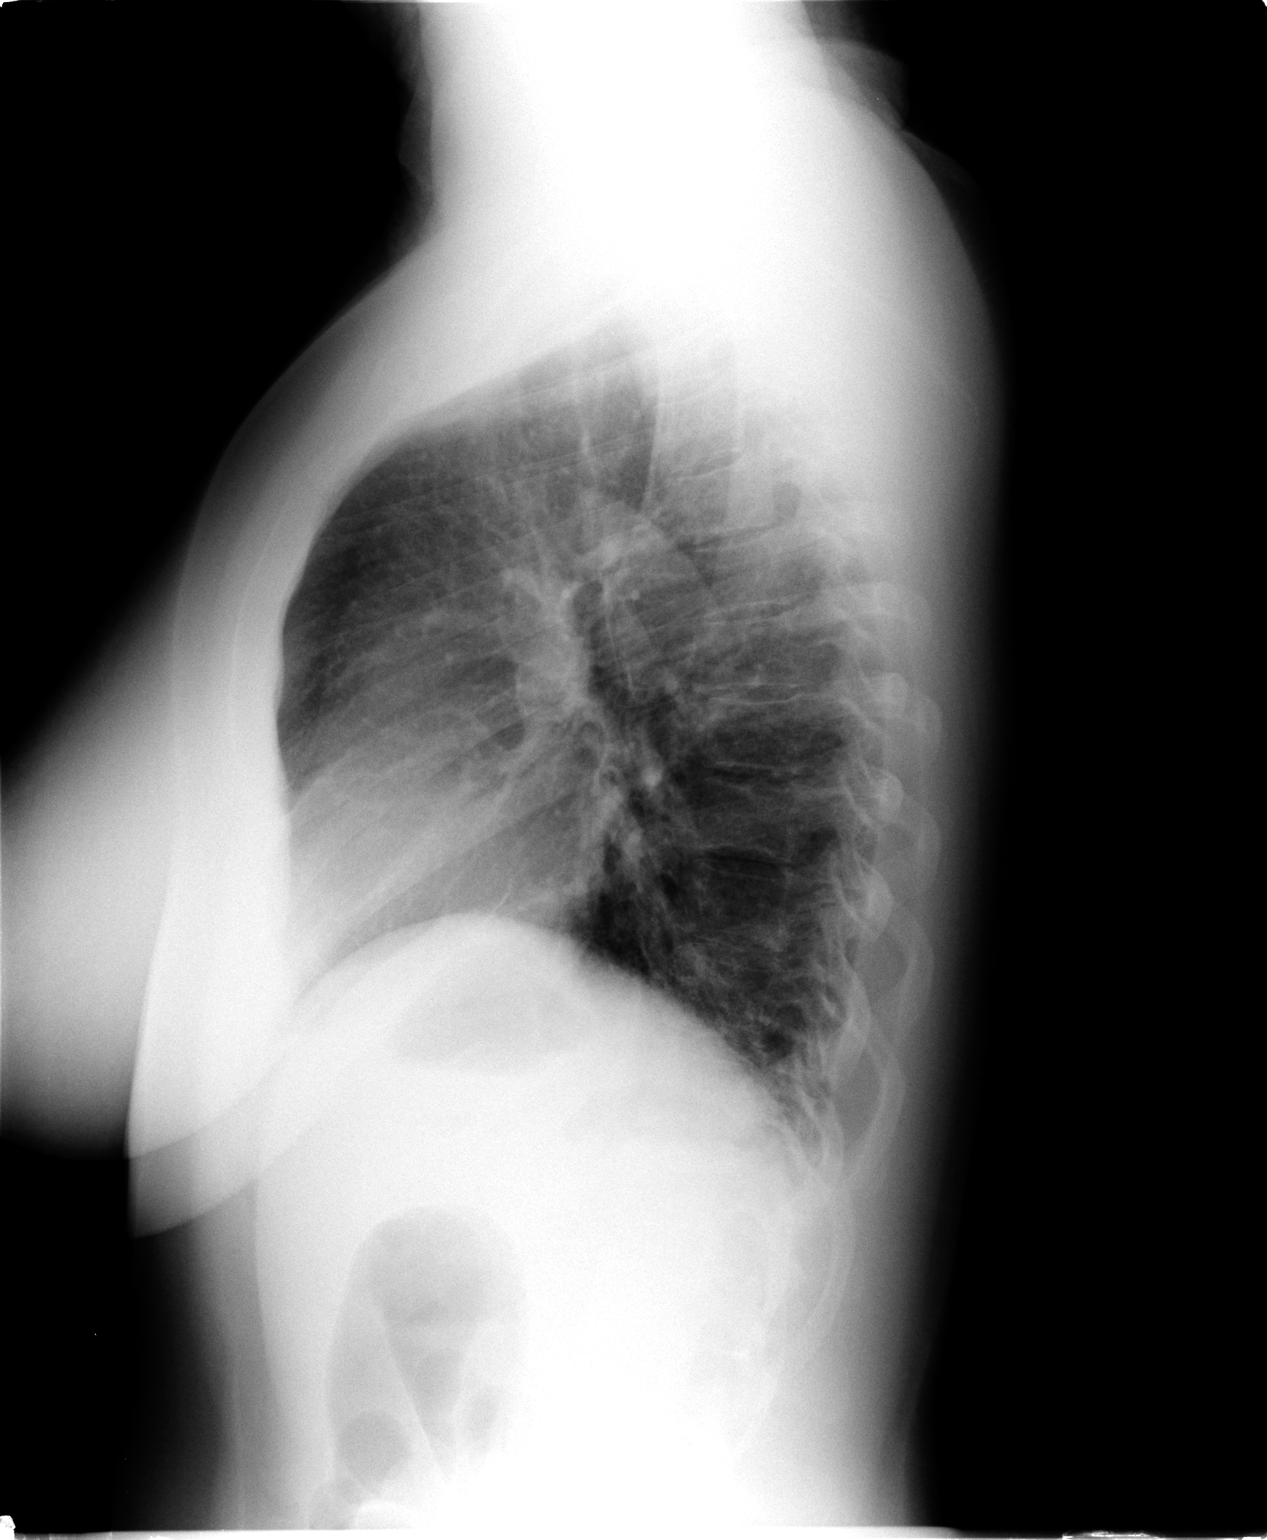

[2 of 2 positions shown; findings below may reference images not displayed]

FINDINGS: There is no edema or consolidation.  Heart size and
pulmonary vascularity are normal.  No adenopathy.  No bone lesions.
IMPRESSION: Lungs clear. No abnormality noted.

## 2014-05-30 ENCOUNTER — Encounter: Payer: Self-pay | Admitting: Internal Medicine

## 2014-05-30 ENCOUNTER — Ambulatory Visit (INDEPENDENT_AMBULATORY_CARE_PROVIDER_SITE_OTHER): Payer: Managed Care, Other (non HMO) | Admitting: Internal Medicine

## 2014-05-30 VITALS — BP 130/72 | HR 73 | Temp 98.1°F | Wt 149.8 lb

## 2014-05-30 DIAGNOSIS — B028 Zoster with other complications: Secondary | ICD-10-CM

## 2014-05-30 DIAGNOSIS — L308 Other specified dermatitis: Principal | ICD-10-CM

## 2014-05-30 NOTE — Progress Notes (Signed)
Pre visit review using our clinic review tool, if applicable. No additional management support is needed unless otherwise documented below in the visit note. 

## 2014-05-30 NOTE — Progress Notes (Signed)
   Subjective:    Patient ID: Leslie Beard, female    DOB: 1975-06-26, 39 y.o.   MRN: [id number]  HPI  2 months ago she noted a papular rash in the left mid abdomen incidentally. It did not itch or hurt and had no associated symptoms. There was no trigger such as new clothing, chemical exposure, vector exposure, new medicines, or tick exposure. She has not treated this and there has been no change in the appearance of the lesions. She does have a upcoming Dermatology appointment.  She had chickenpox as an adult 8 years ago without sequela.     Review of Systems  No associated itchy, watery eyes.  Swelling of the lips or tongue or intraoral lesions denied.  Shortness of breath, wheezing, or cough absent.  No vesicles, pustules or urticaria noted.  Fever ,chills , or sweats denied.   Diarrhea not present.  No dysuria, pyuria or hematuria.    Objective:   Physical Exam  Pertinent or positive findings include: In a 5 x 1.5 cm area over the left midabdomen she has multiple small papules. One is more oblong in character; all the others are more circular. These very in size from 2.2 mm to 11 x 3 mm. After these were examined she describes some hypersensitivity in this area.  General appearance :adequately nourished; in no distress. Eyes: No conjunctival inflammation or scleral icterus is present. Oral exam:  Lips and gums are healthy appearing.There is no oropharyngeal erythema or exudate noted. Dental hygiene is good. Heart:  Normal rate and regular rhythm. S1 and S2 normal without gallop, murmur, click, rub or other extra sounds   Lungs:Chest clear to auscultation; no wheezes, rhonchi,rales ,or rubs present.No increased work of breathing.  Abdomen: bowel sounds normal, soft and non-tender without masses, organomegaly or hernias noted.  No guarding or rebound.  Vascular : all pulses equal ; no bruits present. Skin:Warm & dry; no tenting  Lymphatic: No lymphadenopathy is noted  about the head, neck, axilla Neuro: Strength, tone & DTRs normal.        Assessment & Plan:  #1 papular dermatitis; herpetic zoster in T10 is suggested.  Plan: She'll monitor lesions with her cell phone and take the photographs to the Dermatology appointment.

## 2014-05-30 NOTE — Patient Instructions (Signed)
Use your cell phone camera to monitor  the skin lesions. Take a photo of the skin lesions every week with a small ruler immediately below the lesion to define any change in size, shape or color.Take photos to Dr Allyson Sabal.

## 2014-08-23 ENCOUNTER — Ambulatory Visit (INDEPENDENT_AMBULATORY_CARE_PROVIDER_SITE_OTHER): Payer: Managed Care, Other (non HMO) | Admitting: Emergency Medicine

## 2014-08-23 VITALS — BP 122/66 | HR 67 | Temp 98.3°F | Resp 20 | Ht 61.25 in | Wt 151.1 lb

## 2014-08-23 DIAGNOSIS — S335XXA Sprain of ligaments of lumbar spine, initial encounter: Secondary | ICD-10-CM | POA: Diagnosis not present

## 2014-08-23 MED ORDER — CYCLOBENZAPRINE HCL 5 MG PO TABS: 5.0000 mg | ORAL_TABLET | Freq: Three times a day (TID) | ORAL | Status: DC | PRN

## 2014-08-23 MED ORDER — NAPROXEN SODIUM 550 MG PO TABS: 550.0000 mg | ORAL_TABLET | Freq: Two times a day (BID) | ORAL | Status: DC

## 2014-08-23 NOTE — Progress Notes (Signed)
Subjective:  Patient ID: Leslie Beard, female    DOB: February 09, 1975  Age: 39 y.o. MRN: [id number]  CC: Back Pain   HPI Leslie Beard presents  she is a Sales promotion account executive and has 2-3 week history of low back pain. She describes it as nonradiating and not associated with any numbness tingling or weakness in her legs. She has no history of injury or overuse. She's had limited improvement with over-the-counter medication. She's had no dysuria urgency or frequency. No vaginal discharge or bleeding. She had no incontinence urine or stool. She has no history of prior back injury.  History Leslie Beard has a past medical history of Headache(784.0) and Migraine.   She has no past surgical history on file.   Her  family history includes Asthma in her daughter and mother; Cancer in her paternal grandfather and paternal uncle; Cancer (age of onset: 101) in her father; Hypertension in her other.  She   reports that she has never smoked. She does not have any smokeless tobacco history on file. She reports that she does not drink alcohol or use illicit drugs.  Outpatient Prescriptions Prior to Visit  Medication Sig Dispense Refill  . Norgestim-Eth Estrad Triphasic (ORTHO TRI-CYCLEN LO PO) Take by mouth.    . predniSONE (DELTASONE) 10 MG tablet Take 2 tabs for migraine on day #1, 1 tab on day #2, then stop 30 tablet 0  . Cholecalciferol (VITAMIN D3) 1000 UNITS tablet Take 1,000 Units by mouth daily.      . Cyanocobalamin (VITAMIN B-12) 1000 MCG SUBL Place 1 tablet (1,000 mcg total) under the tongue daily. (Patient not taking: Reported on 08/23/2014) 100 tablet 3  . mometasone-formoterol (DULERA) 100-5 MCG/ACT AERO Inhale 2 puffs into the lungs 2 (two) times daily. (Patient not taking: Reported on 08/23/2014) 1 Inhaler 3  . polyethylene glycol powder (GLYCOLAX/MIRALAX) powder Take 17 g by mouth 2 (two) times daily as needed for mild constipation or moderate constipation. (Patient not taking: Reported on 08/23/2014)  500 g 1  . rizatriptan (MAXALT-MLT) 10 MG disintegrating tablet Take 1 tablet (10 mg total) by mouth as needed for migraine. May repeat in 2 hours if needed 10 tablet 0  . SUMAtriptan (IMITREX) 100 MG tablet Take 1 tablet (100 mg total) by mouth every 2 (two) hours as needed for migraine. (Patient not taking: Reported on 08/23/2014) 10 tablet 5  . traMADol (ULTRAM) 50 MG tablet Take 1 tablet (50 mg total) by mouth every 6 (six) hours as needed. (Patient not taking: Reported on 08/23/2014) 30 tablet 0   No facility-administered medications prior to visit.    Social History   Social History  . Marital Status: Married    Spouse Name: N/A  . Number of Children: 3  . Years of Education: N/A   Occupational History  . [employer] [place] History Main Topics  . Smoking status: Never Smoker   . Smokeless tobacco: None  . Alcohol Use: No  . Drug Use: No  . Sexual Activity: Yes    Birth Control/ Protection: Pill   Other Topics Concern  . None   Social History Narrative   Regular Exercise -  NO   Married 2 kids and a step-daughter           Review of Systems  Constitutional: Negative for fever, chills and appetite change.  HENT: Negative for congestion, ear pain, postnasal drip, sinus pressure and sore throat.   Eyes: Negative  for pain and redness.  Respiratory: Negative for cough, shortness of breath and wheezing.   Cardiovascular: Negative for leg swelling.  Gastrointestinal: Negative for nausea, vomiting, abdominal pain, diarrhea, constipation and blood in stool.  Endocrine: Negative for polyuria.  Genitourinary: Negative for dysuria, urgency, frequency and flank pain.  Musculoskeletal: Negative for gait problem.  Skin: Negative for rash.  Neurological: Negative for weakness and headaches.  Psychiatric/Behavioral: Negative for confusion and decreased concentration. The patient is not nervous/anxious.     Objective:  BP 122/66 mmHg  Pulse 67  Temp(Src)  98.3 F (36.8 C) (Oral)  Resp 20  Ht 5' 1.25" (1.556 m)  Wt 151 lb 2 oz (68.55 kg)  BMI 28.31 kg/m2  SpO2 96%  LMP 08/04/2014  Physical Exam  Constitutional: She is oriented to person, place, and time. She appears well-developed and well-nourished. No distress.  HENT:  Head: Normocephalic and atraumatic.  Right Ear: External ear normal.  Left Ear: External ear normal.  Nose: Nose normal.  Eyes: Conjunctivae and EOM are normal. Pupils are equal, round, and reactive to light. No scleral icterus.  Neck: Normal range of motion. Neck supple. No tracheal deviation present.  Cardiovascular: Normal rate, regular rhythm and normal heart sounds.   Pulmonary/Chest: Effort normal. No respiratory distress. She has no wheezes. She has no rales.  Abdominal: She exhibits no mass. There is no tenderness. There is no rebound and no guarding.  Musculoskeletal: She exhibits no edema.       Lumbar back: She exhibits tenderness.  Lymphadenopathy:    She has no cervical adenopathy.  Neurological: She is alert and oriented to person, place, and time. Coordination normal.  Skin: Skin is warm and dry. No rash noted.  Psychiatric: She has a normal mood and affect. Her behavior is normal.      Assessment & Plan:   Leslie Beard was seen today for back pain.  Diagnoses and all orders for this visit:  Sprain of lumbar region, initial encounter  Other orders -     naproxen sodium (ANAPROX DS) 550 MG tablet; Take 1 tablet (550 mg total) by mouth 2 (two) times daily with a meal. -     cyclobenzaprine (FLEXERIL) 5 MG tablet; Take 1 tablet (5 mg total) by mouth 3 (three) times daily as needed for muscle spasms.   I am having Leslie Beard start on naproxen sodium and cyclobenzaprine. I am also having her maintain her cholecalciferol, rizatriptan, mometasone-formoterol, SUMAtriptan, predniSONE, Norgestim-Eth Estrad Triphasic (ORTHO TRI-CYCLEN LO PO), polyethylene glycol powder, Vitamin B-12, and traMADol.  Meds  ordered this encounter  Medications  . naproxen sodium (ANAPROX DS) 550 MG tablet    Sig: Take 1 tablet (550 mg total) by mouth 2 (two) times daily with a meal.    Dispense:  40 tablet    Refill:  0  . cyclobenzaprine (FLEXERIL) 5 MG tablet    Sig: Take 1 tablet (5 mg total) by mouth 3 (three) times daily as needed for muscle spasms.    Dispense:  30 tablet    Refill:  1    Appropriate red flag conditions were discussed with the patient as well as actions that should be taken.  Patient expressed his understanding.  Follow-up: Return if symptoms worsen or fail to improve.  Roselee Culver, MD

## 2014-08-23 NOTE — Patient Instructions (Signed)

## 2014-09-09 ENCOUNTER — Ambulatory Visit (INDEPENDENT_AMBULATORY_CARE_PROVIDER_SITE_OTHER): Payer: Managed Care, Other (non HMO) | Admitting: Internal Medicine

## 2014-09-09 ENCOUNTER — Encounter: Payer: Self-pay | Admitting: Internal Medicine

## 2014-09-09 VITALS — BP 122/88 | HR 78 | Temp 98.9°F | Wt 151.0 lb

## 2014-09-09 DIAGNOSIS — D171 Benign lipomatous neoplasm of skin and subcutaneous tissue of trunk: Secondary | ICD-10-CM

## 2014-09-09 DIAGNOSIS — B029 Zoster without complications: Secondary | ICD-10-CM

## 2014-09-09 NOTE — Progress Notes (Signed)
Pre visit review using our clinic review tool, if applicable. No additional management support is needed unless otherwise documented below in the visit note. 

## 2014-09-09 NOTE — Assessment & Plan Note (Signed)
5/16 L abd s/p derm consult Hyperpigmentations from shingles to the L from umbilicus - a 5 cm cluster

## 2014-09-09 NOTE — Progress Notes (Signed)
Subjective:  Patient ID: Leslie Beard, female    DOB: [birth year]  Age: 39 y.o. MRN: [id number]  CC: No chief complaint on file.   HPI Leslie Beard presents for a knot on L abd x 1 week. Pt had poss shingles back in 5/16 - saw a Derm (treated)  Outpatient Prescriptions Prior to Visit  Medication Sig Dispense Refill  . Cholecalciferol (VITAMIN D3) 1000 UNITS tablet Take 1,000 Units by mouth daily.      . Cyanocobalamin (VITAMIN B-12) 1000 MCG SUBL Place 1 tablet (1,000 mcg total) under the tongue daily. 100 tablet 3  . cyclobenzaprine (FLEXERIL) 5 MG tablet Take 1 tablet (5 mg total) by mouth 3 (three) times daily as needed for muscle spasms. 30 tablet 1  . mometasone-formoterol (DULERA) 100-5 MCG/ACT AERO Inhale 2 puffs into the lungs 2 (two) times daily. 1 Inhaler 3  . naproxen sodium (ANAPROX DS) 550 MG tablet Take 1 tablet (550 mg total) by mouth 2 (two) times daily with a meal. 40 tablet 0  . polyethylene glycol powder (GLYCOLAX/MIRALAX) powder Take 17 g by mouth 2 (two) times daily as needed for mild constipation or moderate constipation. 500 g 1  . predniSONE (DELTASONE) 10 MG tablet Take 2 tabs for migraine on day #1, 1 tab on day #2, then stop 30 tablet 0  . SUMAtriptan (IMITREX) 100 MG tablet Take 1 tablet (100 mg total) by mouth every 2 (two) hours as needed for migraine. 10 tablet 5  . traMADol (ULTRAM) 50 MG tablet Take 1 tablet (50 mg total) by mouth every 6 (six) hours as needed. 30 tablet 0  . Norgestim-Eth Estrad Triphasic (ORTHO TRI-CYCLEN LO PO) Take by mouth.    . rizatriptan (MAXALT-MLT) 10 MG disintegrating tablet Take 1 tablet (10 mg total) by mouth as needed for migraine. May repeat in 2 hours if needed 10 tablet 0   No facility-administered medications prior to visit.    ROS Review of Systems  Constitutional: Negative for fatigue.  Gastrointestinal: Negative for nausea, vomiting, abdominal pain, blood in stool and abdominal distention.  Skin: Positive for  rash.    Objective:  BP 122/88 mmHg  Pulse 78  Temp(Src) 98.9 F (37.2 C) (Oral)  Wt 151 lb (68.493 kg)  SpO2 99%  LMP 08/04/2014  BP Readings from Last 3 Encounters:  09/09/14 122/88  08/23/14 122/66  05/30/14 130/72    Wt Readings from Last 3 Encounters:  09/09/14 151 lb (68.493 kg)  08/23/14 151 lb 2 oz (68.55 kg)  05/30/14 149 lb 12 oz (67.926 kg)    Physical Exam  Constitutional: She appears well-nourished. No distress.  Abdominal: She exhibits mass. She exhibits no distension. There is no tenderness. There is no rebound.  Skin: Rash noted.    2x1 cm lipoma abd wall 10 cm below L breast (POC [place] c/w lipoma) Hyperpigmentations from shingles to the L from umbilicus - a 5 cm cluster  Lab Results  Component Value Date   WBC 8.3 07/29/2013   HGB 13.4 07/29/2013   HCT 39.5 07/29/2013   PLT 213.0 07/29/2013   GLUCOSE 79 07/29/2013   CHOL 190 07/29/2013   TRIG 79.0 07/29/2013   HDL 53.60 07/29/2013   LDLCALC 121* 07/29/2013   ALT 10 07/29/2013   AST 17 07/29/2013   NA 136 07/29/2013   K 3.9 07/29/2013   CL 103 07/29/2013   CREATININE 0.8 07/29/2013   BUN 10 07/29/2013   CO2 24 07/29/2013   TSH  1.92 07/29/2013    Dg Ankle Complete Right  10/04/2013   CLINICAL DATA:  Right ankle pain and swelling.  EXAM: RIGHT ANKLE - COMPLETE 3+ VIEW  COMPARISON:  None.  FINDINGS: There is no evidence of fracture, dislocation, or joint effusion. There is no evidence of arthropathy or other focal bone abnormality. Soft tissue swelling is seen over lateral malleolus.  IMPRESSION: No fracture or dislocation is noted. Soft tissue swelling is noted over lateral malleolus suggesting ligamentous injury.   Electronically Signed   By: Sabino Dick M.D.   On: 10/04/2013 18:07    Assessment & Plan:   There are no diagnoses linked to this encounter. I have discontinued Ms. Oka Norgestim-Eth Radene Journey Triphasic (ORTHO TRI-CYCLEN LO PO). I am also having her maintain her cholecalciferol,  rizatriptan, mometasone-formoterol, SUMAtriptan, predniSONE, polyethylene glycol powder, Vitamin B-12, traMADol, naproxen sodium, cyclobenzaprine, TRI-PREVIFEM, triamcinolone cream, and valACYclovir.  Meds ordered this encounter  Medications  . TRI-PREVIFEM 0.18/0.215/0.25 MG-35 MCG tablet    Sig: Take 1 tablet by mouth daily.    Refill:  3  . triamcinolone cream (KENALOG) 0.1 %    Sig: APPLY TO THE SKIN TWICE A DAY TO AFFECTED AREAS TWICE A DAY FOR 2 WEEKS NOT TO EXCEED FOR 2 WEEKS    Refill:  0  . valACYclovir (VALTREX) 1000 MG tablet    Sig: 1 TABLET BY MOUTH THREE TIMES A DAY    Refill:  0     Follow-up: No Follow-up on file.  Walker Kehr, MD

## 2014-09-09 NOTE — Assessment & Plan Note (Addendum)
8/16 2x1 cm lipoma abd wall 10 cm below L breast (POC [place] c/w lipoma) Will watch

## 2014-10-17 ENCOUNTER — Encounter: Payer: Self-pay | Admitting: Internal Medicine

## 2014-10-17 ENCOUNTER — Ambulatory Visit (INDEPENDENT_AMBULATORY_CARE_PROVIDER_SITE_OTHER): Payer: Managed Care, Other (non HMO) | Admitting: Internal Medicine

## 2014-10-17 ENCOUNTER — Other Ambulatory Visit (INDEPENDENT_AMBULATORY_CARE_PROVIDER_SITE_OTHER): Payer: Managed Care, Other (non HMO)

## 2014-10-17 VITALS — BP 110/78 | HR 68 | Ht 60.25 in | Wt 153.0 lb

## 2014-10-17 DIAGNOSIS — D171 Benign lipomatous neoplasm of skin and subcutaneous tissue of trunk: Secondary | ICD-10-CM | POA: Diagnosis not present

## 2014-10-17 DIAGNOSIS — E559 Vitamin D deficiency, unspecified: Secondary | ICD-10-CM | POA: Diagnosis not present

## 2014-10-17 DIAGNOSIS — Z0000 Encounter for general adult medical examination without abnormal findings: Secondary | ICD-10-CM

## 2014-10-17 DIAGNOSIS — B0229 Other postherpetic nervous system involvement: Secondary | ICD-10-CM | POA: Diagnosis not present

## 2014-10-17 DIAGNOSIS — E538 Deficiency of other specified B group vitamins: Secondary | ICD-10-CM | POA: Diagnosis not present

## 2014-10-17 LAB — VITAMIN D 25 HYDROXY (VIT D DEFICIENCY, FRACTURES): VITD: 14.66 ng/mL — ABNORMAL LOW (ref 30.00–100.00)

## 2014-10-17 LAB — LIPID PANEL
CHOLESTEROL: 176 mg/dL (ref 0–200)
HDL: 62.6 mg/dL (ref >39.00)
LDL Cholesterol: 104 mg/dL — ABNORMAL HIGH (ref 0–99)
NONHDL: 113.46
Total CHOL/HDL Ratio: 3
Triglycerides: 46 mg/dL (ref 0.0–149.0)
VLDL: 9.2 mg/dL (ref 0.0–40.0)

## 2014-10-17 LAB — CBC WITH DIFFERENTIAL/PLATELET
Basophils Absolute: 0.1 10*3/uL (ref 0.0–0.1)
Basophils Relative: 0.6 % (ref 0.0–3.0)
EOS PCT: 1.8 % (ref 0.0–5.0)
Eosinophils Absolute: 0.2 10*3/uL (ref 0.0–0.7)
HCT: 40.3 % (ref 36.0–46.0)
HEMOGLOBIN: 13.6 g/dL (ref 12.0–15.0)
Lymphocytes Relative: 20.4 % (ref 12.0–46.0)
Lymphs Abs: 2.1 10*3/uL (ref 0.7–4.0)
MCHC: 33.8 g/dL (ref 30.0–36.0)
MCV: 92.7 fl (ref 78.0–100.0)
MONOS PCT: 5.7 % (ref 3.0–12.0)
Monocytes Absolute: 0.6 10*3/uL (ref 0.1–1.0)
Neutro Abs: 7.2 10*3/uL (ref 1.4–7.7)
Neutrophils Relative %: 71.5 % (ref 43.0–77.0)
Platelets: 230 10*3/uL (ref 150.0–400.0)
RBC: 4.35 Mil/uL (ref 3.87–5.11)
RDW: 13 % (ref 11.5–15.5)
WBC: 10.1 10*3/uL (ref 4.0–10.5)

## 2014-10-17 LAB — HEPATIC FUNCTION PANEL
ALBUMIN: 4 g/dL (ref 3.5–5.2)
ALT: 8 U/L (ref 0–35)
AST: 14 U/L (ref 0–37)
Alkaline Phosphatase: 51 U/L (ref 39–117)
BILIRUBIN TOTAL: 0.5 mg/dL (ref 0.2–1.2)
Bilirubin, Direct: 0.2 mg/dL (ref 0.0–0.3)
Total Protein: 7.4 g/dL (ref 6.0–8.3)

## 2014-10-17 LAB — BASIC METABOLIC PANEL
BUN: 14 mg/dL (ref 6–23)
CO2: 28 mEq/L (ref 19–32)
Calcium: 9.3 mg/dL (ref 8.4–10.5)
Chloride: 103 mEq/L (ref 96–112)
Creatinine, Ser: 0.74 mg/dL (ref 0.40–1.20)
GFR: 111.34 mL/min (ref >60.00)
Glucose, Bld: 82 mg/dL (ref 70–99)
POTASSIUM: 3.7 meq/L (ref 3.5–5.1)
SODIUM: 138 meq/L (ref 135–145)

## 2014-10-17 LAB — TSH: TSH: 1 u[IU]/mL (ref 0.35–4.50)

## 2014-10-17 LAB — URIC ACID: URIC ACID, SERUM: 4.6 mg/dL (ref 2.4–7.0)

## 2014-10-17 LAB — VITAMIN B12: Vitamin B-12: 209 pg/mL — ABNORMAL LOW (ref 211–911)

## 2014-10-17 MED ORDER — GABAPENTIN 100 MG PO CAPS: 100.0000 mg | ORAL_CAPSULE | Freq: Every evening | ORAL | Status: DC | PRN

## 2014-10-17 NOTE — Assessment & Plan Note (Signed)
We discussed age appropriate health related issues, including available/recomended screening tests and vaccinations. We discussed a need for adhering to healthy diet and exercise. Labs/EKG were reviewed/ordered. All questions were answered.   

## 2014-10-17 NOTE — Assessment & Plan Note (Signed)
Gabapentin at hs

## 2014-10-17 NOTE — Assessment & Plan Note (Signed)
Enlarging 3x2cm mass - ? lipoma abd wall 10 cm below L breast (POC [place] c/w lipoma) vs other (?hernia) or seb cyst - growing  Surg ref

## 2014-10-17 NOTE — Progress Notes (Signed)
Pre visit review using our clinic review tool, if applicable. No additional management support is needed unless otherwise documented below in the visit note. 

## 2014-10-17 NOTE — Progress Notes (Signed)
Subjective:  Patient ID: Leslie Beard, female    DOB: [birth year]  Age: 39 y.o. MRN: [id number]  CC: Annual Exam   HPI Jessye P Schwalbe presents for a well exam C/o pain after shingles x months - worse at night  Outpatient Prescriptions Prior to Visit  Medication Sig Dispense Refill  . mometasone-formoterol (DULERA) 100-5 MCG/ACT AERO Inhale 2 puffs into the lungs 2 (two) times daily. 1 Inhaler 3  . naproxen sodium (ANAPROX DS) 550 MG tablet Take 1 tablet (550 mg total) by mouth 2 (two) times daily with a meal. 40 tablet 0  . polyethylene glycol powder (GLYCOLAX/MIRALAX) powder Take 17 g by mouth 2 (two) times daily as needed for mild constipation or moderate constipation. 500 g 1  . predniSONE (DELTASONE) 10 MG tablet Take 2 tabs for migraine on day #1, 1 tab on day #2, then stop 30 tablet 0  . SUMAtriptan (IMITREX) 100 MG tablet Take 1 tablet (100 mg total) by mouth every 2 (two) hours as needed for migraine. 10 tablet 5  . traMADol (ULTRAM) 50 MG tablet Take 1 tablet (50 mg total) by mouth every 6 (six) hours as needed. 30 tablet 0  . TRI-PREVIFEM 0.18/0.215/0.25 MG-35 MCG tablet Take 1 tablet by mouth daily.  3  . valACYclovir (VALTREX) 1000 MG tablet 1 TABLET BY MOUTH THREE TIMES A DAY  0  . Cholecalciferol (VITAMIN D3) 1000 UNITS tablet Take 1,000 Units by mouth daily.      . Cyanocobalamin (VITAMIN B-12) 1000 MCG SUBL Place 1 tablet (1,000 mcg total) under the tongue daily. 100 tablet 3  . cyclobenzaprine (FLEXERIL) 5 MG tablet Take 1 tablet (5 mg total) by mouth 3 (three) times daily as needed for muscle spasms. 30 tablet 1  . triamcinolone cream (KENALOG) 0.1 % APPLY TO THE SKIN TWICE A DAY TO AFFECTED AREAS TWICE A DAY FOR 2 WEEKS NOT TO EXCEED FOR 2 WEEKS  0  . rizatriptan (MAXALT-MLT) 10 MG disintegrating tablet Take 1 tablet (10 mg total) by mouth as needed for migraine. May repeat in 2 hours if needed 10 tablet 0   No facility-administered medications prior to visit.     ROS Review of Systems  Constitutional: Positive for fatigue. Negative for chills, activity change, appetite change and unexpected weight change.  HENT: Negative for congestion, mouth sores and sinus pressure.   Eyes: Negative for visual disturbance.  Respiratory: Negative for cough and chest tightness.   Gastrointestinal: Negative for nausea, abdominal pain and blood in stool.  Genitourinary: Negative for frequency, difficulty urinating and vaginal pain.  Musculoskeletal: Negative for back pain and gait problem.  Skin: Positive for rash. Negative for pallor.  Neurological: Negative for dizziness, tremors, weakness, numbness and headaches.  Psychiatric/Behavioral: Negative for confusion and sleep disturbance.    Objective:  BP 110/78 mmHg  Pulse 68  Ht 5' 0.25" (1.53 m)  Wt 153 lb (69.4 kg)  BMI 29.65 kg/m2  SpO2 98%  BP Readings from Last 3 Encounters:  10/17/14 110/78  09/09/14 122/88  08/23/14 122/66    Wt Readings from Last 3 Encounters:  10/17/14 153 lb (69.4 kg)  09/09/14 151 lb (68.493 kg)  08/23/14 151 lb 2 oz (68.55 kg)    Physical Exam  Constitutional: She appears well-developed. No distress.  HENT:  Head: Normocephalic.  Right Ear: External ear normal.  Left Ear: External ear normal.  Nose: Nose normal.  Mouth/Throat: Oropharynx is clear and moist.  Eyes: Conjunctivae are normal. Pupils are  equal, round, and reactive to light. Right eye exhibits no discharge. Left eye exhibits no discharge.  Neck: Normal range of motion. Neck supple. No JVD present. No tracheal deviation present. No thyromegaly present.  Cardiovascular: Normal rate, regular rhythm and normal heart sounds.   Pulmonary/Chest: No stridor. No respiratory distress. She has no wheezes.  Abdominal: Soft. Bowel sounds are normal. She exhibits mass. She exhibits no distension. There is no tenderness. There is no rebound and no guarding.  Musculoskeletal: She exhibits no edema or tenderness.   Lymphadenopathy:    She has no cervical adenopathy.  Neurological: She displays normal reflexes. No cranial nerve deficit. She exhibits normal muscle tone. Coordination normal.  Skin: No rash noted. No erythema.  Psychiatric: She has a normal mood and affect. Her behavior is normal. Judgment and thought content normal.  ?L abd oval elasic mass 2x3 cm - mobile, sensitive  Lab Results  Component Value Date   WBC 10.1 10/17/2014   HGB 13.6 10/17/2014   HCT 40.3 10/17/2014   PLT 230.0 10/17/2014   GLUCOSE 82 10/17/2014   CHOL 176 10/17/2014   TRIG 46.0 10/17/2014   HDL 62.60 10/17/2014   LDLCALC 104* 10/17/2014   ALT 8 10/17/2014   AST 14 10/17/2014   NA 138 10/17/2014   K 3.7 10/17/2014   CL 103 10/17/2014   CREATININE 0.74 10/17/2014   BUN 14 10/17/2014   CO2 28 10/17/2014   TSH 1.00 10/17/2014    Dg Ankle Complete Right  10/04/2013   CLINICAL DATA:  Right ankle pain and swelling.  EXAM: RIGHT ANKLE - COMPLETE 3+ VIEW  COMPARISON:  None.  FINDINGS: There is no evidence of fracture, dislocation, or joint effusion. There is no evidence of arthropathy or other focal bone abnormality. Soft tissue swelling is seen over lateral malleolus.  IMPRESSION: No fracture or dislocation is noted. Soft tissue swelling is noted over lateral malleolus suggesting ligamentous injury.   Electronically Signed   By: Sabino Dick M.D.   On: 10/04/2013 18:07    Assessment & Plan:   Arilla was seen today for annual exam.  Diagnoses and all orders for this visit:  Well adult exam -     Basic metabolic panel; Future -     CBC with Differential/Platelet; Future -     Hepatic function panel; Future -     Lipid panel; Future -     TSH; Future -     Uric acid; Future -     Vitamin B12; Future -     Vit D  25 hydroxy (rtn osteoporosis monitoring); Future  Postherpetic neuralgia -     Basic metabolic panel; Future -     CBC with Differential/Platelet; Future -     Hepatic function panel; Future -      Lipid panel; Future -     TSH; Future -     Uric acid; Future -     Vitamin B12; Future -     Vit D  25 hydroxy (rtn osteoporosis monitoring); Future  Lipoma of abdominal wall -     Ambulatory referral to General Surgery -     Basic metabolic panel; Future -     CBC with Differential/Platelet; Future -     Hepatic function panel; Future -     Lipid panel; Future -     TSH; Future -     Uric acid; Future -     Vitamin B12; Future -  Vit D  25 hydroxy (rtn osteoporosis monitoring); Future  Other orders -     gabapentin (NEURONTIN) 100 MG capsule; Take 1-2 capsules (100-200 mg total) by mouth at bedtime as needed.   I have discontinued Ms. Anfinson rizatriptan, cyclobenzaprine, and triamcinolone cream. I am also having her start on gabapentin. Additionally, I am having her maintain her mometasone-formoterol, SUMAtriptan, predniSONE, polyethylene glycol powder, traMADol, naproxen sodium, TRI-PREVIFEM, and valACYclovir.  Meds ordered this encounter  Medications  . gabapentin (NEURONTIN) 100 MG capsule    Sig: Take 1-2 capsules (100-200 mg total) by mouth at bedtime as needed.    Dispense:  60 capsule    Refill:  5     Follow-up: Return in about 6 months (around 04/17/2015) for a follow-up visit.  Walker Kehr, MD

## 2014-10-18 ENCOUNTER — Other Ambulatory Visit: Payer: Self-pay | Admitting: Internal Medicine

## 2014-10-18 DIAGNOSIS — E559 Vitamin D deficiency, unspecified: Secondary | ICD-10-CM

## 2014-10-18 DIAGNOSIS — E538 Deficiency of other specified B group vitamins: Secondary | ICD-10-CM

## 2014-10-18 MED ORDER — "SYRINGE/NEEDLE (DISP) 30G X 1/2"" 1 ML MISC": 1.0000 | Status: DC

## 2014-10-18 MED ORDER — CYANOCOBALAMIN 1000 MCG/ML IJ SOLN: 1000.0000 ug | Freq: Once | INTRAMUSCULAR | Status: DC

## 2014-10-18 MED ORDER — VITAMIN D3 25 MCG (1000 UNIT) PO TABS: 2000.0000 [IU] | ORAL_TABLET | Freq: Every day | ORAL | Status: DC

## 2014-10-20 ENCOUNTER — Telehealth: Payer: Self-pay | Admitting: Internal Medicine

## 2014-10-20 NOTE — Telephone Encounter (Signed)
Please follow up with patient in regards to surgeon referral.

## 2014-10-21 DIAGNOSIS — E559 Vitamin D deficiency, unspecified: Secondary | ICD-10-CM | POA: Insufficient documentation

## 2014-10-21 NOTE — Assessment & Plan Note (Signed)
start Vit D prescription 50000 iu weekly (Rx emailed to your pharmacy) followed by over-the-counter Vit D 1000 iu daily.  

## 2014-10-21 NOTE — Assessment & Plan Note (Signed)
Check B12 

## 2014-10-22 ENCOUNTER — Encounter: Payer: Managed Care, Other (non HMO) | Admitting: Internal Medicine

## 2014-10-23 NOTE — Telephone Encounter (Signed)
Referral faxed to [facility] on 10/22/14.

## 2014-10-27 ENCOUNTER — Ambulatory Visit (INDEPENDENT_AMBULATORY_CARE_PROVIDER_SITE_OTHER): Payer: Managed Care, Other (non HMO)

## 2014-10-27 DIAGNOSIS — E538 Deficiency of other specified B group vitamins: Secondary | ICD-10-CM | POA: Diagnosis not present

## 2014-10-27 MED ORDER — CYANOCOBALAMIN 1000 MCG/ML IJ SOLN
1000.0000 ug | Freq: Once | INTRAMUSCULAR | Status: AC
  Administered 2014-10-27: 1000 ug via INTRAMUSCULAR

## 2014-10-30 ENCOUNTER — Other Ambulatory Visit: Payer: Self-pay | Admitting: General Surgery

## 2014-10-30 DIAGNOSIS — D171 Benign lipomatous neoplasm of skin and subcutaneous tissue of trunk: Secondary | ICD-10-CM

## 2014-11-06 ENCOUNTER — Ambulatory Visit
Admission: RE | Admit: 2014-11-06 | Discharge: 2014-11-06 | Disposition: A | Discharge status: Home / Self Care | Payer: Managed Care, Other (non HMO) | Source: Ambulatory Visit | Attending: General Surgery | Admitting: General Surgery

## 2014-11-06 ENCOUNTER — Other Ambulatory Visit: Payer: Self-pay | Admitting: General Surgery

## 2014-11-06 DIAGNOSIS — D171 Benign lipomatous neoplasm of skin and subcutaneous tissue of trunk: Secondary | ICD-10-CM

## 2014-11-11 ENCOUNTER — Telehealth: Payer: Self-pay | Admitting: Internal Medicine

## 2014-11-11 NOTE — Telephone Encounter (Signed)
Patient would like to know if she can get her b12 injections at her place of employment because the injections are expensive coming to [place], and they are free of charge at her place of employment

## 2014-11-11 NOTE — Telephone Encounter (Signed)
Yes, sure! Thx AP

## 2014-11-12 ENCOUNTER — Ambulatory Visit: Payer: Managed Care, Other (non HMO)

## 2014-11-12 NOTE — Telephone Encounter (Signed)
Notified pt with md response. She is needing msg to give to the nurse stating its ok for her to give. Faxing this phone note to [phone]...Johny Chess

## 2015-03-18 ENCOUNTER — Ambulatory Visit: Payer: Managed Care, Other (non HMO) | Admitting: Family

## 2015-03-18 ENCOUNTER — Ambulatory Visit (INDEPENDENT_AMBULATORY_CARE_PROVIDER_SITE_OTHER): Payer: Managed Care, Other (non HMO) | Admitting: Family Medicine

## 2015-03-18 ENCOUNTER — Encounter: Payer: Self-pay | Admitting: Family Medicine

## 2015-03-18 VITALS — BP 138/100 | HR 74 | Temp 98.5°F | Ht 60.25 in | Wt 156.8 lb

## 2015-03-18 DIAGNOSIS — R1900 Intra-abdominal and pelvic swelling, mass and lump, unspecified site: Secondary | ICD-10-CM

## 2015-03-18 NOTE — Patient Instructions (Addendum)
It was nice to see you today Please schedule a follow-up with your surgeon as they may want to do a biopsy at this point If not let me know and we can do a CT scan  Dr. Marlou Starks Address: [address] [place], [place], [place] [address] Phone: [phone]

## 2015-03-18 NOTE — Progress Notes (Signed)
Pre visit review using our clinic review tool, if applicable. No additional management support is needed unless otherwise documented below in the visit note. 

## 2015-03-18 NOTE — Progress Notes (Signed)
[facility] [address] [place], [place] [address] [phone]  Date:  03/18/2015   Name:  Leslie Beard   DOB:  07-06-1975   MRN:  [id number]  PCP:  Walker Kehr, MD    Chief Complaint: Rash   History of Present Illness:  Leslie Beard is a 40 y.o. very pleasant female patient who presents with the following:  Here today with complaint of "spots on my side." About 7- 8 months ago she noted a red, raised area on her left side just superior to the umbilicus.  She went to MD and was told it was shingles.  She then developed a few more ares and a raised area that was thought to be lipoma.  An [place] did not show anything in the area of interest.   She saw dermatology about 4 months ago and was given valtrex- did not seem to help.  She also saw a surgeon who felt that as the [place] was negative there was nothing further to do   It hurts at this time- like "a burning or a scraping feeling" that will come and go.  The sx are always on her left side, never elsewhere.  She feels a firm area just under her skin.  She is getting concerned as these have been present for so long .  The skin just over the firm areas is hyperpigmented- no other rashes.  The area is tender- she has not tried any medications for it as of yet She has not noted any fevers, night sweats or weight loss No cough. She has not noted any other systemic symptoms   LMP is current Never a smoker- she is generally in good health   Patient Active Problem List   Diagnosis Date Noted  . Vitamin D deficiency 10/21/2014  . Postherpetic neuralgia 10/17/2014  . 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 Shingles 09/09/2014  . Other malaise and fatigue 07/29/2013  . B12 deficiency 07/29/2013  . Well adult exam 02/21/2012  . Dyspnea on exertion 02/08/2012  . Headache disorder 01/10/2011  . Migraine headache 01/10/2011  . UTI (urinary tract infection) 05/12/2010  . Left flank  pain 05/12/2010  . Hematuria, microscopic 05/12/2010  . URI 05/15/2008  . Acute bronchitis 12/03/2007    Past Medical History  Diagnosis Date  . Headache(784.0)   . Migraine     No past surgical history on file.  Social History  Substance Use Topics  . Smoking status: Never Smoker   . Smokeless tobacco: None  . Alcohol Use: No    Family History  Problem Relation Age of Onset  . Hypertension Other   . Asthma Mother   . Asthma Daughter   . Cancer Father 32    colon, esoph ca  . Cancer Paternal Uncle     lung ca  . Cancer Paternal Grandfather     bone ca    Allergies  Allergen Reactions  . Sulfacetamide Sodium     Medication list has been reviewed and updated.  Current Outpatient Prescriptions on File Prior to Visit  Medication Sig Dispense Refill  . cholecalciferol (VITAMIN D) 1000 UNITS tablet Take 2 tablets (2,000 Units total) by mouth daily. 100 tablet 3  . cyanocobalamin (,VITAMIN B-12,) 1000 MCG/ML injection Inject 1 mL (1,000 mcg total) into the muscle once. 10 mL 6  . gabapentin (NEURONTIN) 100 MG capsule Take 1-2 capsules (100-200 mg total) by  mouth at bedtime as needed. 60 capsule 5  . mometasone-formoterol (DULERA) 100-5 MCG/ACT AERO Inhale 2 puffs into the lungs 2 (two) times daily. 1 Inhaler 3  . naproxen sodium (ANAPROX DS) 550 MG tablet Take 1 tablet (550 mg total) by mouth 2 (two) times daily with a meal. 40 tablet 0  . polyethylene glycol powder (GLYCOLAX/MIRALAX) powder Take 17 g by mouth 2 (two) times daily as needed for mild constipation or moderate constipation. 500 g 1  . predniSONE (DELTASONE) 10 MG tablet Take 2 tabs for migraine on day #1, 1 tab on day #2, then stop 30 tablet 0  . SUMAtriptan (IMITREX) 100 MG tablet Take 1 tablet (100 mg total) by mouth every 2 (two) hours as needed for migraine. 10 tablet 5  . Syringe/Needle, Disp, (B-D ECLIPSE SYRINGE) 30G X 1/2" 1 ML MISC 1 each by Does not apply route 1 day or 1 dose. 50 each 11  .  traMADol (ULTRAM) 50 MG tablet Take 1 tablet (50 mg total) by mouth every 6 (six) hours as needed. 30 tablet 0  . TRI-PREVIFEM 0.18/0.215/0.25 MG-35 MCG tablet Take 1 tablet by mouth daily.  3  . valACYclovir (VALTREX) 1000 MG tablet 1 TABLET BY MOUTH THREE TIMES A DAY  0   No current facility-administered medications on file prior to visit.    Review of Systems:  As per HPI- otherwise negative.   Physical Examination: Filed Vitals:   03/18/15 1440  BP: 138/100  Pulse: 74  Temp: 98.5 F (36.9 C)   Filed Vitals:   03/18/15 1440  Height: 5' 0.25" (1.53 m)  Weight: 156 lb 12.8 oz (71.124 kg)   Body mass index is 30.38 kg/(m^2). Ideal Body Weight: Weight in (lb) to have BMI = 25: 128.8  GEN: WDWN, NAD, Non-toxic, A & O x 3, overweight, looks well HEENT: Atraumatic, Normocephalic. Neck supple. No masses, No LAD. Ears and Nose: No external deformity. CV: RRR, No M/G/R. No JVD. No thrill. No extra heart sounds. PULM: CTA B, no wheezes, crackles, rhonchi. No retractions. No resp. distress. No accessory muscle use. ABD: S, NT, ND, +BS. No rebound. No HSM. Just lateral/ superior to the umbilicus on the left are 2 adjacent firm areas that seem to be in the dermis or superficial fat.  The most proximal is approx 2x3 cm and the other is a bit smaller.  The skin overlying  EXTR: No c/c/e NEURO Normal gait.  PSYCH: Normally interactive. Conversant. Not depressed or anxious appearing.  Calm demeanor.    Assessment and Plan: Mass in the abdomen  Here today with unusual firm areas in the superficial abdomen. These have been present for a few months and she is getting concerned.  We wonder if general surgery may want to do a Bx at this point- if not we will plan for a CT.  She will follow-up with Dr. Marlou Starks who she saw a few months ago and will let me know if anything else is needed   Signed Lamar Blinks, MD

## 2015-03-23 ENCOUNTER — Encounter: Payer: Self-pay | Admitting: Internal Medicine

## 2015-03-23 ENCOUNTER — Telehealth: Payer: Self-pay | Admitting: Internal Medicine

## 2015-03-23 DIAGNOSIS — R222 Localized swelling, mass and lump, trunk: Secondary | ICD-10-CM

## 2015-03-23 NOTE — Telephone Encounter (Signed)
Called pt- she would like to go ahead and do a CT to try and eval the mass on her abd wall.  This is fine- will arrange for her She just ended her LMP yesterday.  She will come by the office for a urine HCG test prior to her scan which we will also schedule at med center

## 2015-03-23 NOTE — Telephone Encounter (Signed)
error:[id number] ° °

## 2015-03-23 NOTE — Telephone Encounter (Addendum)
Relation to [id number] Call back [phone]   Reason for call:  Patient last seen 03/18/15 and as per patient advised to call and request CT orders form PCP. Please advise

## 2015-03-27 ENCOUNTER — Ambulatory Visit (HOSPITAL_BASED_OUTPATIENT_CLINIC_OR_DEPARTMENT_OTHER): Payer: Managed Care, Other (non HMO)

## 2015-04-13 ENCOUNTER — Telehealth: Payer: Self-pay | Admitting: Internal Medicine

## 2015-04-13 NOTE — Telephone Encounter (Signed)
[place] [facility] - Client [facility] [facility]  Patient Name: Leslie Beard  DOB: [birth year]    Initial Comment Caller states this weekend bp was high 140/100 at [facility]. Today is 152/100 and 148/94, has headache.   Nurse Assessment  Nurse: Wayne Sever, RN, Tillie Rung Date/Time (Eastern Time): 04/13/2015 3:12:42 PM  Confirm and document reason for call. If symptomatic, describe symptoms. You must click the next button to save text entered. ---Caller states she is calling about her BP she states on Friday her pressure was 140/100. She had the nurse at work check her BP and it was 152/100(left arm), 148/94(right arm). She states she has been taking OTC cold medication. She is also taking Prednisone for cough. She is wondering if the medications are making her BP go up.  Has the patient traveled out of the country within the last 30 days? ---Not Applicable  Does the patient have any new or worsening symptoms? ---Yes  Will a triage be completed? ---Yes  Related visit to physician within the last 2 weeks? ---No  Does the PT have any chronic conditions? (i.e. diabetes, asthma, etc.) ---No  Is the patient pregnant or possibly pregnant? (Ask all females between the ages of 9-55) ---No  Is this a behavioral health or substance abuse call? ---No     Guidelines    Guideline Title Affirmed Question Affirmed Notes  High Blood Pressure [1] BP ? 140/90 AND [2] not taking BP medications    Final Disposition User   See PCP within 2 Deidre Ala, RN, Tillie Rung    Referrals  REFERRED TO PCP OFFICE   Disagree/Comply: Leta Baptist

## 2015-04-16 ENCOUNTER — Ambulatory Visit (INDEPENDENT_AMBULATORY_CARE_PROVIDER_SITE_OTHER): Payer: Managed Care, Other (non HMO) | Admitting: Internal Medicine

## 2015-04-16 ENCOUNTER — Other Ambulatory Visit (INDEPENDENT_AMBULATORY_CARE_PROVIDER_SITE_OTHER): Payer: Managed Care, Other (non HMO)

## 2015-04-16 ENCOUNTER — Encounter: Payer: Self-pay | Admitting: Internal Medicine

## 2015-04-16 VITALS — BP 136/100 | HR 64 | Temp 98.9°F | Ht 60.25 in | Wt 157.2 lb

## 2015-04-16 DIAGNOSIS — IMO0001 Reserved for inherently not codable concepts without codable children: Secondary | ICD-10-CM

## 2015-04-16 DIAGNOSIS — E538 Deficiency of other specified B group vitamins: Secondary | ICD-10-CM | POA: Diagnosis not present

## 2015-04-16 DIAGNOSIS — E559 Vitamin D deficiency, unspecified: Secondary | ICD-10-CM | POA: Diagnosis not present

## 2015-04-16 DIAGNOSIS — D171 Benign lipomatous neoplasm of skin and subcutaneous tissue of trunk: Secondary | ICD-10-CM

## 2015-04-16 DIAGNOSIS — L819 Disorder of pigmentation, unspecified: Secondary | ICD-10-CM

## 2015-04-16 DIAGNOSIS — R030 Elevated blood-pressure reading, without diagnosis of hypertension: Secondary | ICD-10-CM

## 2015-04-16 LAB — BASIC METABOLIC PANEL
BUN: 14 mg/dL (ref 6–23)
CALCIUM: 9.3 mg/dL (ref 8.4–10.5)
CO2: 31 mEq/L (ref 19–32)
CREATININE: 0.85 mg/dL (ref 0.40–1.20)
Chloride: 102 mEq/L (ref 96–112)
GFR: 94.65 mL/min (ref >60.00)
Glucose, Bld: 85 mg/dL (ref 70–99)
Potassium: 3.7 mEq/L (ref 3.5–5.1)
Sodium: 140 mEq/L (ref 135–145)

## 2015-04-16 LAB — VITAMIN B12: VITAMIN B 12: 980 pg/mL — AB (ref 211–911)

## 2015-04-16 MED ORDER — TRIAMTERENE-HCTZ 37.5-25 MG PO TABS: 1.0000 | ORAL_TABLET | Freq: Every day | ORAL | Status: DC

## 2015-04-16 NOTE — Patient Instructions (Addendum)
BP Readings from Last 3 Encounters:  04/16/15 136/100  03/18/15 138/100  10/17/14 110/78      Hypertension Hypertension, commonly called high blood pressure, is when the force of blood pumping through your arteries is too strong. Your arteries are the blood vessels that carry blood from your heart throughout your body. A blood pressure reading consists of a higher number over a lower number, such as 110/72. The higher number (systolic) is the pressure inside your arteries when your heart pumps. The lower number (diastolic) is the pressure inside your arteries when your heart relaxes. Ideally you want your blood pressure below 120/80. Hypertension forces your heart to work harder to pump blood. Your arteries may become narrow or stiff. Having untreated or uncontrolled hypertension can cause heart attack, stroke, kidney disease, and other problems. RISK FACTORS Some risk factors for high blood pressure are controllable. Others are not.  Risk factors you cannot control include:   Race. You may be at higher risk if you are African American.  Age. Risk increases with age.  Gender. Men are at higher risk than women before age 83 years. After age 10, women are at higher risk than men. Risk factors you can control include:  Not getting enough exercise or physical activity.  Being overweight.  Getting too much fat, sugar, calories, or salt in your diet.  Drinking too much alcohol. SIGNS AND SYMPTOMS Hypertension does not usually cause signs or symptoms. Extremely high blood pressure (hypertensive crisis) may cause headache, anxiety, shortness of breath, and nosebleed. DIAGNOSIS To check if you have hypertension, your health care provider will measure your blood pressure while you are seated, with your arm held at the level of your heart. It should be measured at least twice using the same arm. Certain conditions can cause a difference in blood pressure between your right and left arms. A blood  pressure reading that is higher than normal on one occasion does not mean that you need treatment. If it is not clear whether you have high blood pressure, you may be asked to return on a different day to have your blood pressure checked again. Or, you may be asked to monitor your blood pressure at home for 1 or more weeks. TREATMENT Treating high blood pressure includes making lifestyle changes and possibly taking medicine. Living a healthy lifestyle can help lower high blood pressure. You may need to change some of your habits. Lifestyle changes may include:  Following the DASH diet. This diet is high in fruits, vegetables, and whole grains. It is low in salt, red meat, and added sugars.  Keep your sodium intake below 2,300 mg per day.  Getting at least 30-45 minutes of aerobic exercise at least 4 times per week.  Losing weight if necessary.  Not smoking.  Limiting alcoholic beverages.  Learning ways to reduce stress. Your health care provider may prescribe medicine if lifestyle changes are not enough to get your blood pressure under control, and if one of the following is true:  You are 38-57 years of age and your systolic blood pressure is above 140.  You are 63 years of age or older, and your systolic blood pressure is above 150.  Your diastolic blood pressure is above 90.  You have diabetes, and your systolic blood pressure is over [id number] or your diastolic blood pressure is over 90.  You have kidney disease and your blood pressure is above 140/90.  You have heart disease and your blood pressure is above 140/90.  Your personal target blood pressure may vary depending on your medical conditions, your age, and other factors. HOME CARE INSTRUCTIONS  Have your blood pressure rechecked as directed by your health care provider.   Take medicines only as directed by your health care provider. Follow the directions carefully. Blood pressure medicines must be taken as prescribed. The  medicine does not work as well when you skip doses. Skipping doses also puts you at risk for problems.  Do not smoke.   Monitor your blood pressure at home as directed by your health care provider. SEEK MEDICAL CARE IF:   You think you are having a reaction to medicines taken.  You have recurrent headaches or feel dizzy.  You have swelling in your ankles.  You have trouble with your vision. SEEK IMMEDIATE MEDICAL CARE IF:  You develop a severe headache or confusion.  You have unusual weakness, numbness, or feel faint.  You have severe chest or abdominal pain.  You vomit repeatedly.  You have trouble breathing. MAKE SURE YOU:   Understand these instructions.  Will watch your condition.  Will get help right away if you are not doing well or get worse.   This information is not intended to replace advice given to you by your health care provider. Make sure you discuss any questions you have with your health care provider.   Document Released: 12/27/2004 Document Revised: 05/13/2014 Document Reviewed: 10/19/2012 Elsevier Interactive Patient Education [employer].

## 2015-04-16 NOTE — Assessment & Plan Note (Signed)
4/17 taking BCP. Now on a dose-pack NAS diet Start Maxzide

## 2015-04-16 NOTE — Assessment & Plan Note (Signed)
On Vit D Labs 

## 2015-04-16 NOTE — Assessment & Plan Note (Signed)
On B12 SL Labs 

## 2015-04-16 NOTE — Progress Notes (Signed)
Subjective:  Patient ID: Leslie Beard, female    DOB: 09-02-1975  Age: 40 y.o. MRN: [id number]  CC: Follow-up   HPI Leslie Beard presents for elevated BP, recent cold - using steroids C/o abd wall mass  Outpatient Prescriptions Prior to Visit  Medication Sig Dispense Refill  . cholecalciferol (VITAMIN D) 1000 UNITS tablet Take 2 tablets (2,000 Units total) by mouth daily. 100 tablet 3  . predniSONE (DELTASONE) 10 MG tablet Take 2 tabs for migraine on day #1, 1 tab on day #2, then stop 30 tablet 0  . SUMAtriptan (IMITREX) 100 MG tablet Take 1 tablet (100 mg total) by mouth every 2 (two) hours as needed for migraine. 10 tablet 5  . TRI-PREVIFEM 0.18/0.215/0.25 MG-35 MCG tablet Take 1 tablet by mouth daily.  3  . cyanocobalamin (,VITAMIN B-12,) 1000 MCG/ML injection Inject 1 mL (1,000 mcg total) into the muscle once. 10 mL 6  . mometasone-formoterol (DULERA) 100-5 MCG/ACT AERO Inhale 2 puffs into the lungs 2 (two) times daily. 1 Inhaler 3  . naproxen sodium (ANAPROX DS) 550 MG tablet Take 1 tablet (550 mg total) by mouth 2 (two) times daily with a meal. 40 tablet 0  . polyethylene glycol powder (GLYCOLAX/MIRALAX) powder Take 17 g by mouth 2 (two) times daily as needed for mild constipation or moderate constipation. 500 g 1  . Syringe/Needle, Disp, (B-D ECLIPSE SYRINGE) 30G X 1/2" 1 ML MISC 1 each by Does not apply route 1 day or 1 dose. 50 each 11  . traMADol (ULTRAM) 50 MG tablet Take 1 tablet (50 mg total) by mouth every 6 (six) hours as needed. 30 tablet 0   No facility-administered medications prior to visit.    ROS Review of Systems  Constitutional: Negative for chills, activity change, appetite change, fatigue and unexpected weight change.  HENT: Negative for congestion, mouth sores and sinus pressure.   Eyes: Negative for visual disturbance.  Respiratory: Negative for cough and chest tightness.   Gastrointestinal: Negative for nausea and abdominal pain.  Genitourinary:  Negative for frequency, difficulty urinating and vaginal pain.  Musculoskeletal: Negative for back pain and gait problem.  Skin: Negative for pallor and rash.  Neurological: Negative for dizziness, tremors, weakness, numbness and headaches.  Psychiatric/Behavioral: Negative for confusion and sleep disturbance.    Objective:  BP 136/100 mmHg  Pulse 64  Temp(Src) 98.9 F (37.2 C) (Oral)  Ht 5' 0.25" (1.53 m)  Wt 157 lb 4 oz (71.328 kg)  BMI 30.47 kg/m2  SpO2 98%  LMP 04/12/2015  BP Readings from Last 3 Encounters:  04/16/15 136/100  03/18/15 138/100  10/17/14 110/78    Wt Readings from Last 3 Encounters:  04/16/15 157 lb 4 oz (71.328 kg)  03/18/15 156 lb 12.8 oz (71.124 kg)  10/17/14 153 lb (69.4 kg)    Physical Exam  Constitutional: She appears well-developed. No distress.  HENT:  Head: Normocephalic.  Right Ear: External ear normal.  Left Ear: External ear normal.  Nose: Nose normal.  Mouth/Throat: Oropharynx is clear and moist.  Eyes: Conjunctivae are normal. Pupils are equal, round, and reactive to light. Right eye exhibits no discharge. Left eye exhibits no discharge.  Neck: Normal range of motion. Neck supple. No JVD present. No tracheal deviation present. No thyromegaly present.  Cardiovascular: Normal rate, regular rhythm and normal heart sounds.   Pulmonary/Chest: No stridor. No respiratory distress. She has no wheezes.  Abdominal: Soft. Bowel sounds are normal. She exhibits no distension and no mass.  There is no tenderness. There is no rebound and no guarding.  Musculoskeletal: She exhibits no edema or tenderness.  Lymphadenopathy:    She has no cervical adenopathy.  Neurological: She displays normal reflexes. No cranial nerve deficit. She exhibits normal muscle tone. Coordination normal.  Skin: No rash noted. No erythema.  Psychiatric: She has a normal mood and affect. Her behavior is normal. Judgment and thought content normal.  L abd hyperpigmented macules  with sq nodules underneath  Lab Results  Component Value Date   WBC 10.1 10/17/2014   HGB 13.6 10/17/2014   HCT 40.3 10/17/2014   PLT 230.0 10/17/2014   GLUCOSE 82 10/17/2014   CHOL 176 10/17/2014   TRIG 46.0 10/17/2014   HDL 62.60 10/17/2014   LDLCALC 104* 10/17/2014   ALT 8 10/17/2014   AST 14 10/17/2014   NA 138 10/17/2014   K 3.7 10/17/2014   CL 103 10/17/2014   CREATININE 0.74 10/17/2014   BUN 14 10/17/2014   CO2 28 10/17/2014   TSH 1.00 10/17/2014    US Abdomen Limited  11/06/2014  CLINICAL DATA:  Evaluate for lipoma of left flank. EXAM: LIMITED ABDOMINAL ULTRASOUND COMPARISON:  None. FINDINGS: Anatomic area evaluated:  Left flank.  Area of swelling. Soft tissue mass:  None identified Complex sister fluid collection:  None identified. Hernia:  None identified. IMPRESSION: No solid or cystic mass visualized in the area of concern. Electronically Signed   By: Kerby Moors M.D.   On: 11/06/2014 08:38    Assessment & Plan:   There are no diagnoses linked to this encounter. I have discontinued Ms. Crosby mometasone-formoterol, polyethylene glycol powder, traMADol, naproxen sodium, cyanocobalamin, and Syringe/Needle (Disp). I am also having her maintain her SUMAtriptan, predniSONE, TRI-PREVIFEM, cholecalciferol, methylPREDNISolone, benzonatate, albuterol, and vitamin B-12.  Meds ordered this encounter  Medications  . methylPREDNISolone (MEDROL) 4 MG TBPK tablet    Sig:   . benzonatate (TESSALON PERLES) 100 MG capsule    Sig: Take 200 mg by mouth. Reported on 04/16/2015  . albuterol (PROAIR HFA) 108 (90 Base) MCG/ACT inhaler    Sig: Inhale into the lungs. Reported on 04/16/2015  . vitamin B-12 (CYANOCOBALAMIN) 1000 MCG tablet    Sig: Take 1,000 mcg by mouth daily.     Follow-up: No Follow-up on file.  Walker Kehr, MD

## 2015-04-16 NOTE — Assessment & Plan Note (Signed)
S/p surgical consult Pt is very worried - Derm ref

## 2016-07-05 ENCOUNTER — Encounter: Payer: Self-pay | Admitting: Internal Medicine

## 2016-07-05 ENCOUNTER — Ambulatory Visit (INDEPENDENT_AMBULATORY_CARE_PROVIDER_SITE_OTHER): Payer: Managed Care, Other (non HMO) | Admitting: Internal Medicine

## 2016-07-05 ENCOUNTER — Other Ambulatory Visit (INDEPENDENT_AMBULATORY_CARE_PROVIDER_SITE_OTHER): Payer: Managed Care, Other (non HMO)

## 2016-07-05 VITALS — BP 128/84 | HR 72 | Temp 98.6°F | Ht 60.25 in | Wt 165.0 lb

## 2016-07-05 DIAGNOSIS — E538 Deficiency of other specified B group vitamins: Secondary | ICD-10-CM

## 2016-07-05 DIAGNOSIS — Z0000 Encounter for general adult medical examination without abnormal findings: Secondary | ICD-10-CM

## 2016-07-05 DIAGNOSIS — E559 Vitamin D deficiency, unspecified: Secondary | ICD-10-CM

## 2016-07-05 DIAGNOSIS — Z800 Family history of malignant neoplasm of digestive organs: Secondary | ICD-10-CM | POA: Insufficient documentation

## 2016-07-05 LAB — HEPATIC FUNCTION PANEL
ALK PHOS: 66 U/L (ref 39–117)
ALT: 9 U/L (ref 0–35)
AST: 14 U/L (ref 0–37)
Albumin: 4 g/dL (ref 3.5–5.2)
BILIRUBIN DIRECT: 0.1 mg/dL (ref 0.0–0.3)
TOTAL PROTEIN: 7.1 g/dL (ref 6.0–8.3)
Total Bilirubin: 0.5 mg/dL (ref 0.2–1.2)

## 2016-07-05 LAB — URINALYSIS, ROUTINE W REFLEX MICROSCOPIC
Bilirubin Urine: NEGATIVE
Ketones, ur: NEGATIVE
LEUKOCYTES UA: NEGATIVE
NITRITE: NEGATIVE
PH: 6.5 (ref 5.0–8.0)
Specific Gravity, Urine: 1.015 (ref 1.000–1.030)
TOTAL PROTEIN, URINE-UPE24: NEGATIVE
URINE GLUCOSE: NEGATIVE
Urobilinogen, UA: 0.2 (ref 0.0–1.0)

## 2016-07-05 LAB — BASIC METABOLIC PANEL
BUN: 16 mg/dL (ref 6–23)
CALCIUM: 9.4 mg/dL (ref 8.4–10.5)
CHLORIDE: 104 meq/L (ref 96–112)
CO2: 27 mEq/L (ref 19–32)
CREATININE: 0.77 mg/dL (ref 0.40–1.20)
GFR: 105.46 mL/min (ref >60.00)
Glucose, Bld: 96 mg/dL (ref 70–99)
Potassium: 4.1 mEq/L (ref 3.5–5.1)
SODIUM: 139 meq/L (ref 135–145)

## 2016-07-05 LAB — CBC WITH DIFFERENTIAL/PLATELET
BASOS ABS: 0.1 10*3/uL (ref 0.0–0.1)
BASOS PCT: 1 % (ref 0.0–3.0)
EOS ABS: 0.1 10*3/uL (ref 0.0–0.7)
Eosinophils Relative: 1.8 % (ref 0.0–5.0)
HEMATOCRIT: 39.7 % (ref 36.0–46.0)
HEMOGLOBIN: 13.6 g/dL (ref 12.0–15.0)
LYMPHS PCT: 27 % (ref 12.0–46.0)
Lymphs Abs: 1.9 10*3/uL (ref 0.7–4.0)
MCHC: 34.3 g/dL (ref 30.0–36.0)
MCV: 91.5 fl (ref 78.0–100.0)
MONOS PCT: 7.3 % (ref 3.0–12.0)
Monocytes Absolute: 0.5 10*3/uL (ref 0.1–1.0)
Neutro Abs: 4.5 10*3/uL (ref 1.4–7.7)
Neutrophils Relative %: 62.9 % (ref 43.0–77.0)
Platelets: 260 10*3/uL (ref 150.0–400.0)
RBC: 4.34 Mil/uL (ref 3.87–5.11)
RDW: 12.7 % (ref 11.5–15.5)
WBC: 7.1 10*3/uL (ref 4.0–10.5)

## 2016-07-05 LAB — LIPID PANEL
CHOL/HDL RATIO: 3
Cholesterol: 191 mg/dL (ref 0–200)
HDL: 56.4 mg/dL (ref >39.00)
LDL Cholesterol: 123 mg/dL — ABNORMAL HIGH (ref 0–99)
NONHDL: 134.58
Triglycerides: 60 mg/dL (ref 0.0–149.0)
VLDL: 12 mg/dL (ref 0.0–40.0)

## 2016-07-05 LAB — TSH: TSH: 1.12 u[IU]/mL (ref 0.35–4.50)

## 2016-07-05 LAB — VITAMIN B12: VITAMIN B 12: 226 pg/mL (ref 211–911)

## 2016-07-05 LAB — VITAMIN D 25 HYDROXY (VIT D DEFICIENCY, FRACTURES): VITD: 20.91 ng/mL — AB (ref 30.00–100.00)

## 2016-07-05 NOTE — Assessment & Plan Note (Signed)
We discussed age appropriate health related issues, including available/recomended screening tests and vaccinations. We discussed a need for adhering to healthy diet and exercise. Labs were ordered to be later reviewed . All questions were answered.   

## 2016-07-05 NOTE — Assessment & Plan Note (Signed)
On Vit B12 po

## 2016-07-05 NOTE — Assessment & Plan Note (Addendum)
F w/colon ca in his early 60s GI ref

## 2016-07-05 NOTE — Progress Notes (Signed)
Subjective:  Patient ID: Leslie Beard, female    DOB: 14-Jul-1975  Age: 41 y.o. MRN: [id number]  CC: No chief complaint on file.   HPI [name] presents for a well exam. F/u B12, D def  Outpatient Medications Prior to Visit  Medication Sig Dispense Refill  . cholecalciferol (VITAMIN D) 1000 UNITS tablet Take 2 tablets (2,000 Units total) by mouth daily. 100 tablet 3  . predniSONE (DELTASONE) 10 MG tablet Take 2 tabs for migraine on day #1, 1 tab on day #2, then stop 30 tablet 0  . SUMAtriptan (IMITREX) 100 MG tablet Take 1 tablet (100 mg total) by mouth every 2 (two) hours as needed for migraine. 10 tablet 5  . TRI-PREVIFEM 0.18/0.215/0.25 MG-35 MCG tablet Take 1 tablet by mouth daily.  3  . triamterene-hydrochlorothiazide (MAXZIDE-25) 37.5-25 MG tablet Take 1 tablet by mouth daily. 30 tablet 11  . vitamin B-12 (CYANOCOBALAMIN) 1000 MCG tablet Take 1,000 mcg by mouth daily.    Marland Kitchen albuterol (PROAIR HFA) 108 (90 Base) MCG/ACT inhaler Inhale into the lungs. Reported on 04/16/2015     No facility-administered medications prior to visit.     ROS Review of Systems  Constitutional: Negative for activity change, appetite change, chills, fatigue and unexpected weight change.  HENT: Negative for congestion, mouth sores and sinus pressure.   Eyes: Negative for visual disturbance.  Respiratory: Negative for cough and chest tightness.   Gastrointestinal: Negative for abdominal pain and nausea.  Genitourinary: Negative for difficulty urinating, frequency and vaginal pain.  Musculoskeletal: Negative for back pain and gait problem.  Skin: Negative for pallor and rash.  Neurological: Negative for dizziness, tremors, weakness, numbness and headaches.  Psychiatric/Behavioral: Negative for confusion, sleep disturbance and suicidal ideas. The patient is not nervous/anxious.     Objective:  BP 128/84 (BP Location: Left Arm, Patient Position: Sitting, Cuff Size: Large)   Pulse 72   Temp 98.6 F  (37 C) (Oral)   Ht 5' 0.25" (1.53 m)   Wt 165 lb (74.8 kg)   SpO2 99%   BMI 31.96 kg/m   BP Readings from Last 3 Encounters:  07/05/16 128/84  04/16/15 (!) 136/100  03/18/15 (!) 138/100    Wt Readings from Last 3 Encounters:  07/05/16 165 lb (74.8 kg)  04/16/15 157 lb 4 oz (71.3 kg)  03/18/15 156 lb 12.8 oz (71.1 kg)    Physical Exam  Constitutional: She appears well-developed. No distress.  HENT:  Head: Normocephalic.  Right Ear: External ear normal.  Left Ear: External ear normal.  Nose: Nose normal.  Mouth/Throat: Oropharynx is clear and moist.  Eyes: Conjunctivae are normal. Pupils are equal, round, and reactive to light. Right eye exhibits no discharge. Left eye exhibits no discharge.  Neck: Normal range of motion. Neck supple. No JVD present. No tracheal deviation present. No thyromegaly present.  Cardiovascular: Normal rate, regular rhythm and normal heart sounds.   Pulmonary/Chest: No stridor. No respiratory distress. She has no wheezes.  Abdominal: Soft. Bowel sounds are normal. She exhibits no distension and no mass. There is no tenderness. There is no rebound and no guarding.  Musculoskeletal: She exhibits no edema or tenderness.  Lymphadenopathy:    She has no cervical adenopathy.  Neurological: She displays normal reflexes. No cranial nerve deficit. She exhibits normal muscle tone. Coordination normal.  Skin: No rash noted. No erythema.  Psychiatric: She has a normal mood and affect. Her behavior is normal. Judgment and thought content normal.  Obese  Lab Results  Component Value Date   WBC 10.1 10/17/2014   HGB 13.6 10/17/2014   HCT 40.3 10/17/2014   PLT 230.0 10/17/2014   GLUCOSE 85 04/16/2015   CHOL 176 10/17/2014   TRIG 46.0 10/17/2014   HDL 62.60 10/17/2014   LDLCALC 104 (H) 10/17/2014   ALT 8 10/17/2014   AST 14 10/17/2014   NA 140 04/16/2015   K 3.7 04/16/2015   CL 102 04/16/2015   CREATININE 0.85 04/16/2015   BUN 14 04/16/2015   CO2 31  04/16/2015   TSH 1.00 10/17/2014    US Abdomen Limited  Result Date: 11/06/2014 CLINICAL DATA:  Evaluate for lipoma of left flank. EXAM: LIMITED ABDOMINAL ULTRASOUND COMPARISON:  None. FINDINGS: Anatomic area evaluated:  Left flank.  Area of swelling. Soft tissue mass:  None identified Complex sister fluid collection:  None identified. Hernia:  None identified. IMPRESSION: No solid or cystic mass visualized in the area of concern. Electronically Signed   By: Kerby Moors M.D.   On: 11/06/2014 08:38    Assessment & Plan:   There are no diagnoses linked to this encounter. I am having Ms. Bellis maintain her SUMAtriptan, predniSONE, TRI-PREVIFEM, cholecalciferol, albuterol, vitamin B-12, and triamterene-hydrochlorothiazide.  No orders of the defined types were placed in this encounter.    Follow-up: No Follow-up on file.  Walker Kehr, MD

## 2016-07-05 NOTE — Assessment & Plan Note (Signed)
On Vit D 

## 2016-07-06 MED ORDER — VITAMIN D3 1.25 MG (50000 UT) PO CAPS: 1.0000 | ORAL_CAPSULE | ORAL | 0 refills | Status: DC

## 2016-08-08 ENCOUNTER — Encounter: Payer: Self-pay | Admitting: Gastroenterology

## 2016-10-10 ENCOUNTER — Encounter: Payer: Self-pay | Admitting: Gastroenterology

## 2016-10-10 ENCOUNTER — Ambulatory Visit (INDEPENDENT_AMBULATORY_CARE_PROVIDER_SITE_OTHER): Payer: Managed Care, Other (non HMO) | Admitting: Gastroenterology

## 2016-10-10 VITALS — BP 140/80 | HR 72 | Ht 61.0 in | Wt 166.5 lb

## 2016-10-10 DIAGNOSIS — K5909 Other constipation: Secondary | ICD-10-CM

## 2016-10-10 DIAGNOSIS — Z800 Family history of malignant neoplasm of digestive organs: Secondary | ICD-10-CM | POA: Diagnosis not present

## 2016-10-10 MED ORDER — NA SULFATE-K SULFATE-MG SULF 17.5-3.13-1.6 GM/177ML PO SOLN: 1.0000 | Freq: Once | ORAL | 0 refills | Status: AC

## 2016-10-10 NOTE — Progress Notes (Signed)
Leslie Beard    [id number]    1976-01-07  Primary Care Physician:Plotnikov, Evie Lacks, MD  Referring Physician: Cassandria Anger, MD [place], [place] [address]  Chief complaint: Family history of colon cancer, Constipation  HPI: 41 year old female here for new patient visit to discuss colorectal cancer screening. She has family history of colon cancer, her father was diagnosed with colon cancer at age 58. She never had colonoscopy. Review of system positive for chronic constipation with bowel movement every 2-3 days. Denies any blood per rectum or melena. No nausea, vomiting, abdominal pain or diarrhea.     Outpatient Encounter Prescriptions as of 10/10/2016  Medication Sig  . albuterol (PROAIR HFA) 108 (90 Base) MCG/ACT inhaler Inhale into the lungs. Reported on 04/16/2015  . cholecalciferol (VITAMIN D) 1000 UNITS tablet Take 2 tablets (2,000 Units total) by mouth daily.  . Cholecalciferol (VITAMIN D3) 50000 units CAPS Take 1 capsule by mouth once a week.  . predniSONE (DELTASONE) 10 MG tablet Take 2 tabs for migraine on day #1, 1 tab on day #2, then stop  . SUMAtriptan (IMITREX) 100 MG tablet Take 1 tablet (100 mg total) by mouth every 2 (two) hours as needed for migraine.  . TRI-PREVIFEM 0.18/0.215/0.25 MG-35 MCG tablet Take 1 tablet by mouth daily.  Marland Kitchen triamterene-hydrochlorothiazide (MAXZIDE-25) 37.5-25 MG tablet Take 1 tablet by mouth daily.  . vitamin B-12 (CYANOCOBALAMIN) 1000 MCG tablet Take 1,000 mcg by mouth daily.   No facility-administered encounter medications on file as of 10/10/2016.     Allergies as of 10/10/2016 - Review Complete 07/05/2016  Allergen Reaction Noted  . Sulfacetamide sodium      Past Medical History:  Diagnosis Date  . Headache(784.0)   . Migraine     No past surgical history on file.  Family History  Problem Relation Age of Onset  . Asthma Mother   . Asthma Daughter   . Cancer Father 44       colon, esoph ca    . Hypertension Father   . Cancer Paternal Uncle        lung ca  . Cancer Paternal Grandfather        bone ca  . Hypertension Other   . Hypertension Maternal Grandmother     Social History   Social History  . Marital status: Married    Spouse name: N/A  . Number of children: 3  . Years of education: N/A   Occupational History  . [employer] [place] History Main Topics  . Smoking status: Never Smoker  . Smokeless tobacco: Never Used  . Alcohol use No  . Drug use: No  . Sexual activity: Yes    Birth control/ protection: Pill   Other Topics Concern  . Not on file   Social History Narrative   Regular Exercise -  NO   Married 2 kids and a step-daughter            Review of systems: Review of Systems  Constitutional: Negative for fever and chills.  HENT: Negative.   Eyes: Negative for blurred vision.  Respiratory: Negative for cough, shortness of breath and wheezing.   Cardiovascular: Negative for chest pain and palpitations.  Gastrointestinal: as per HPI Genitourinary: Negative for dysuria, urgency, frequency and hematuria.  Musculoskeletal: Negative for myalgias, back pain and joint pain.  Skin: Negative for itching and rash.  Neurological: Negative for  dizziness, tremors, focal weakness, seizures and loss of consciousness.  Endo/Heme/Allergies: Positive for seasonal allergies.  Psychiatric/Behavioral: Negative for depression, suicidal ideas and hallucinations.  All other systems reviewed and are negative.   Physical Exam: Vitals:   10/10/16 0817  BP: 140/80  Pulse: 72   Body mass index is 31.46 kg/m. Gen:      No acute distress HEENT:  EOMI, sclera anicteric Neck:     No masses; no thyromegaly Lungs:    Clear to auscultation bilaterally; normal respiratory effort CV:         Regular rate and rhythm; no murmurs Abd:      + bowel sounds; soft, non-tender; no palpable masses, no distension Ext:    No edema; adequate peripheral  perfusion Skin:      Warm and dry; no rash Neuro: alert and oriented x 3 Psych: normal mood and affect  Data Reviewed:  Reviewed labs, radiology imaging, old records and pertinent past GI work up   Assessment and Plan/Recommendations:  41 year old female with chronic constipation and family history of colon cancer, father diagnosed with colon cancer at age 46.  We'll schedule for colonoscopy for colorectal cancer screening given her family history The risks and benefits as well as alternatives of endoscopic procedure(s) have been discussed and reviewed. All questions answered. The patient agrees to proceed.  Chronic constipation: Discussed dietary and lifestyle modification Increase fluid and fiber intake Benefiber 1 tablespoon 3 times daily with meals as needed MiraLAX 1 capful daily as needed   K. Denzil Magnuson , MD [phone] Mon-Fri 8a-5p [phone] after 5p, weekends, holidays  CC: Plotnikov, Evie Lacks, MD

## 2016-10-10 NOTE — Patient Instructions (Signed)
You have been scheduled for a colonoscopy. Please follow written instructions given to you at your visit today.  Please pick up your prep supplies at the pharmacy within the next 1-3 days. If you use inhalers (even only as needed), please bring them with you on the day of your procedure. Your physician has requested that you go to www.startemmi.com and enter the access code given to you at your visit today. This web site gives a general overview about your procedure. However, you should still follow specific instructions given to you by our office regarding your preparation for the procedure.  Increase fluid 8-10 cups of water daily  Use Benefiber 1 tablespoon three times a day with meals   Constipation, Adult Constipation is when a person has fewer bowel movements in a week than normal, has difficulty having a bowel movement, or has stools that are dry, hard, or larger than normal. Constipation may be caused by an underlying condition. It may become worse with age if a person takes certain medicines and does not take in enough fluids. Follow these instructions at home: Eating and drinking   Eat foods that have a lot of fiber, such as fresh fruits and vegetables, whole grains, and beans.  Limit foods that are high in fat, low in fiber, or overly processed, such as french fries, hamburgers, cookies, candies, and soda.  Drink enough fluid to keep your urine clear or pale yellow. General instructions  Exercise regularly or as told by your health care provider.  Go to the restroom when you have the urge to go. Do not hold it in.  Take over-the-counter and prescription medicines only as told by your health care provider. These include any fiber supplements.  Practice pelvic floor retraining exercises, such as deep breathing while relaxing the lower abdomen and pelvic floor relaxation during bowel movements.  Watch your condition for any changes.  Keep all follow-up visits as told by your  health care provider. This is important. Contact a health care provider if:  You have pain that gets worse.  You have a fever.  You do not have a bowel movement after 4 days.  You vomit.  You are not hungry.  You lose weight.  You are bleeding from the anus.  You have thin, pencil-like stools. Get help right away if:  You have a fever and your symptoms suddenly get worse.  You leak stool or have blood in your stool.  Your abdomen is bloated.  You have severe pain in your abdomen.  You feel dizzy or you faint. This information is not intended to replace advice given to you by your health care provider. Make sure you discuss any questions you have with your health care provider. Document Released: 09/25/2003 Document Revised: 07/17/2015 Document Reviewed: 06/17/2015 Elsevier Interactive Patient Education  2017 [employer].

## 2016-10-14 ENCOUNTER — Encounter: Payer: Self-pay | Admitting: Gastroenterology

## 2016-10-24 ENCOUNTER — Ambulatory Visit: Payer: Managed Care, Other (non HMO) | Admitting: Nurse Practitioner

## 2016-10-24 ENCOUNTER — Ambulatory Visit: Payer: Managed Care, Other (non HMO) | Admitting: Family Medicine

## 2016-10-24 ENCOUNTER — Ambulatory Visit (INDEPENDENT_AMBULATORY_CARE_PROVIDER_SITE_OTHER): Payer: Managed Care, Other (non HMO) | Admitting: Family Medicine

## 2016-10-24 ENCOUNTER — Encounter: Payer: Self-pay | Admitting: Family Medicine

## 2016-10-24 VITALS — BP 124/82 | HR 84 | Temp 98.6°F | Ht 61.0 in | Wt 167.0 lb

## 2016-10-24 DIAGNOSIS — J069 Acute upper respiratory infection, unspecified: Secondary | ICD-10-CM

## 2016-10-24 MED ORDER — HYDROCODONE-HOMATROPINE 5-1.5 MG/5ML PO SYRP: 5.0000 mL | ORAL_SOLUTION | Freq: Three times a day (TID) | ORAL | 0 refills | Status: DC | PRN

## 2016-10-24 NOTE — Patient Instructions (Signed)
Thank you for coming in,   Please try things such as zyrtec-D or allegra-D which is an antihistamine and decongestant.   Please try afrin which will help with nasal congestion but use for only three days.   Please also try using a netti pot on a regular occasion.  Honey can help with a sore throat.      Please feel free to call with any questions or concerns at any time, at [phone]. --Dr. Schmitz  

## 2016-10-24 NOTE — Progress Notes (Signed)
Leslie Beard - 41 y.o. female MRN [id number]  Date of birth: [birth year]  SUBJECTIVE:  Including CC & ROS.  Chief Complaint  Patient presents with  . Cough    congestion, dry cough, facial pressure X 4-5 days    Ms. Tholl is a 41 year old female that is presenting with cough and sinus pressure. Her symptoms started Thursday of last week. She has not taken any over-the-counter medications. She felt like her symptoms are getting progressively worse. She is having a productive cough that is green in color. She has had sick contacts at work. She has not had any fevers. She is having a sore throat. Has not had no travel.     Review of Systems  Constitutional: Negative for fever.  HENT: Positive for sinus pressure.   Respiratory: Positive for cough.     HISTORY: Past Medical, Surgical, Social, and Family History Reviewed & Updated per EMR.   Pertinent Historical Findings include:  Past Medical History:  Diagnosis Date  . Migraine     Past Surgical History:  Procedure Laterality Date  . CESAREAN SECTION      Allergies  Allergen Reactions  . Sulfa Antibiotics Hives and Other (See Comments)    Throat tightened up    Family History  Problem Relation Age of Onset  . Asthma Daughter   . Hypertension Father   . Colon cancer Father 45  . Esophageal cancer Father        and gum cancer, smoker  . Lung cancer Paternal Uncle   . Bone cancer Paternal Grandfather   . Seizures Paternal Grandfather   . Hypertension Maternal Grandmother   . Thyroid disease Paternal Grandmother   . Asthma Son   . Seizures Paternal Uncle      Social History   Social History  . Marital status: Married    Spouse name: N/A  . Number of children: 3  . Years of education: N/A   Occupational History  . [employer] [place] History Main Topics  . Smoking status: Never Smoker  . Smokeless tobacco: Never Used  . Alcohol use 0.0 oz/week     Comment: occasional wine-  . Drug use:  No  . Sexual activity: Yes    Birth control/ protection: Pill   Other Topics Concern  . Not on file   Social History Narrative   Regular Exercise -  NO   Married 2 kids and a step-daughter           PHYSICAL EXAM:  VS: BP 124/82 (BP Location: Left Arm, Patient Position: Sitting, Cuff Size: Large)   Pulse 84   Temp 98.6 F (37 C) (Oral)   Ht 5\' 1"  (1.549 m)   Wt 167 lb (75.8 kg)   SpO2 98%   BMI 31.55 kg/m  Physical Exam Gen: NAD, alert, cooperative with exam,well-appearing ENT: normal lips, normal nasal mucosa, tympanic membranes clear and intact bilaterally, no cervical adenopathy, normal oropharynx Eye: normal EOM, normal conjunctiva and lids CV:  no edema, +2 pedal pulses, S1-S2, regular rate and rhythm   Resp: no accessory muscle use, non-labored, clear to auscultation bilaterally, no wheezes or crackles Skin: no rashes, no areas of induration  Neuro: normal tone, normal sensation to touch Psych:  normal insight, alert and oriented MSK: Normal gait, normal strength      ASSESSMENT & PLAN:   Acute upper respiratory infection Symptoms appear to be viral in nature - Advised supportive care -  Hycodan for cough - Follow-up as needed

## 2016-10-24 NOTE — Assessment & Plan Note (Signed)
Symptoms appear to be viral in nature - Advised supportive care - Hycodan for cough - Follow-up as needed

## 2016-10-27 ENCOUNTER — Telehealth: Payer: Self-pay | Admitting: Gastroenterology

## 2016-10-27 NOTE — Telephone Encounter (Signed)
Pt is requesting if possible to speak with a nurse about this to get a clear understanding if dx of constipation will be included in colonoscopy billing.

## 2016-10-27 NOTE — Telephone Encounter (Signed)
Left message for patient notifying her of this to see if she would like to reschedule colonoscopy.

## 2016-10-27 NOTE — Telephone Encounter (Signed)
Colonoscopy was scheduled for colorectal cancer screening because of positive family history of colon cancer in her father at age 43

## 2016-10-28 ENCOUNTER — Encounter: Payer: Managed Care, Other (non HMO) | Admitting: Gastroenterology

## 2016-11-08 ENCOUNTER — Encounter: Payer: Managed Care, Other (non HMO) | Admitting: Gastroenterology

## 2016-12-09 ENCOUNTER — Encounter: Payer: Self-pay | Admitting: *Deleted

## 2016-12-09 ENCOUNTER — Telehealth: Payer: Self-pay | Admitting: Internal Medicine

## 2016-12-09 NOTE — Telephone Encounter (Signed)
This encounter was created in error - please disregard.

## 2016-12-09 NOTE — Telephone Encounter (Signed)
Pt called regarding her bottom being tender when she wipes. She stated that there is something there and don't know what it is. She stated that it looked like a mole and not a hemorroid.  She is having a colonoscopy scheduled for next week. Advised her to check with that office who is doing her colonoscopy, Dr. Silverio Decamp . Pt voiced understanding and will call us back if needed.

## 2016-12-15 ENCOUNTER — Other Ambulatory Visit: Payer: Self-pay

## 2016-12-15 ENCOUNTER — Encounter: Payer: Self-pay | Admitting: Gastroenterology

## 2016-12-15 ENCOUNTER — Ambulatory Visit (AMBULATORY_SURGERY_CENTER): Payer: Managed Care, Other (non HMO) | Admitting: Gastroenterology

## 2016-12-15 VITALS — BP 122/77 | HR 71 | Temp 98.7°F | Resp 12 | Ht 61.0 in | Wt 167.0 lb

## 2016-12-15 DIAGNOSIS — Z1211 Encounter for screening for malignant neoplasm of colon: Secondary | ICD-10-CM | POA: Diagnosis not present

## 2016-12-15 DIAGNOSIS — D124 Benign neoplasm of descending colon: Secondary | ICD-10-CM

## 2016-12-15 DIAGNOSIS — Z800 Family history of malignant neoplasm of digestive organs: Secondary | ICD-10-CM | POA: Diagnosis not present

## 2016-12-15 MED ORDER — SODIUM CHLORIDE 0.9 % IV SOLN: 500.0000 mL | Freq: Once | INTRAVENOUS | Status: DC

## 2016-12-15 NOTE — Progress Notes (Signed)
Pt's states no medical or surgical changes since previsit or office visit. 

## 2016-12-15 NOTE — Progress Notes (Signed)
Report given to PACU, vss 

## 2016-12-15 NOTE — Patient Instructions (Signed)
YOU HAD AN ENDOSCOPIC PROCEDURE TODAY AT THE  ENDOSCOPY CENTER:   Refer to the procedure report that was given to you for any specific questions about what was found during the examination.  If the procedure report does not answer your questions, please call your gastroenterologist to clarify.  If you requested that your care partner not be given the details of your procedure findings, then the procedure report has been included in a sealed envelope for you to review at your convenience later.  YOU SHOULD EXPECT: Some feelings of bloating in the abdomen. Passage of more gas than usual.  Walking can help get rid of the air that was put into your GI tract during the procedure and reduce the bloating. If you had a lower endoscopy (such as a colonoscopy or flexible sigmoidoscopy) you may notice spotting of blood in your stool or on the toilet paper. If you underwent a bowel prep for your procedure, you may not have a normal bowel movement for a few days.  Please Note:  You might notice some irritation and congestion in your nose or some drainage.  This is from the oxygen used during your procedure.  There is no need for concern and it should clear up in a day or so.  SYMPTOMS TO REPORT IMMEDIATELY:   Following lower endoscopy (colonoscopy or flexible sigmoidoscopy):  Excessive amounts of blood in the stool  Significant tenderness or worsening of abdominal pains  Swelling of the abdomen that is new, acute  Fever of 100F or higher    For urgent or emergent issues, a gastroenterologist can be reached at any hour by calling [phone].   DIET:  We do recommend a small meal at first, but then you may proceed to your regular diet.  Drink plenty of fluids but you should avoid alcoholic beverages for 24 hours.  ACTIVITY:  You should plan to take it easy for the rest of today and you should NOT DRIVE or use heavy machinery until tomorrow (because of the sedation medicines used during the test).     FOLLOW UP: Our staff will call the number listed on your records the next business day following your procedure to check on you and address any questions or concerns that you may have regarding the information given to you following your procedure. If we do not reach you, we will leave a message.  However, if you are feeling well and you are not experiencing any problems, there is no need to return our call.  We will assume that you have returned to your regular daily activities without incident.  If any biopsies were taken you will be contacted by phone or by letter within the next 1-3 weeks.  Please call us at [phone] if you have not heard about the biopsies in 3 weeks.    SIGNATURES/CONFIDENTIALITY: You and/or your care partner have signed paperwork which will be entered into your electronic medical record.  These signatures attest to the fact that that the information above on your After Visit Summary has been reviewed and is understood.  Full responsibility of the confidentiality of this discharge information lies with you and/or your care-partner.   Resume medications. Information given on polyps and hemorrhoids. 

## 2016-12-15 NOTE — Progress Notes (Signed)
Called to room to assist during endoscopic procedure.  Patient ID and intended procedure confirmed with present staff. Received instructions for my participation in the procedure from the performing physician.  

## 2016-12-15 NOTE — Op Note (Signed)
[facility] Patient Name: Leslie Beard Procedure Date: 12/15/2016 11:16 AM MRN: [id number] Endoscopist: Mauri Pole , MD Age: 41 Referring MD:  Date of Birth: 17-Feb-1975 Gender: Female Account #: [id number] Procedure:                Colonoscopy Indications:              Screening in patient at increased risk: Colorectal                            cancer in father before age 91 Medicines:                Monitored Anesthesia Care Procedure:                Pre-Anesthesia Assessment:                           - Prior to the procedure, a History and Physical                            was performed, and patient medications and                            allergies were reviewed. The patient's tolerance of                            previous anesthesia was also reviewed. The risks                            and benefits of the procedure and the sedation                            options and risks were discussed with the patient.                            All questions were answered, and informed consent                            was obtained. Prior Anticoagulants: The patient has                            taken no previous anticoagulant or antiplatelet                            agents. ASA Grade Assessment: I - A normal, healthy                            patient. After reviewing the risks and benefits,                            the patient was deemed in satisfactory condition to                            undergo the procedure.  After obtaining informed consent, the colonoscope                            was passed under direct vision. Throughout the                            procedure, the patient's blood pressure, pulse, and                            oxygen saturations were monitored continuously. The                            Model PCF-[id number]) scope was introduced                            through the anus and advanced to  the the cecum,                            identified by appendiceal orifice and ileocecal                            valve. The colonoscopy was performed without                            difficulty. The patient tolerated the procedure                            well. The quality of the bowel preparation was                            excellent. The ileocecal valve, appendiceal                            orifice, and rectum were photographed. Scope In: 11:21:29 AM Scope Out: 11:31:46 AM Scope Withdrawal Time: 0 hours 6 minutes 51 seconds  Total Procedure Duration: 0 hours 10 minutes 17 seconds  Findings:                 The perianal and digital rectal examinations were                            normal.                           A 1 mm polyp was found in the descending colon. The                            polyp was sessile. The polyp was removed with a                            cold biopsy forceps. Resection and retrieval were                            complete.  Non-bleeding internal hemorrhoids were found during                            retroflexion. The hemorrhoids were small. Complications:            No immediate complications. Estimated Blood Loss:     Estimated blood loss was minimal. Impression:               - One 1 mm polyp in the descending colon, removed                            with a cold biopsy forceps. Resected and retrieved.                           - Non-bleeding internal hemorrhoids. Recommendation:           - Patient has a contact number available for                            emergencies. The signs and symptoms of potential                            delayed complications were discussed with the                            patient. Return to normal activities tomorrow.                            Written discharge instructions were provided to the                            patient.                           - Resume previous  diet.                           - Continue present medications.                           - Await pathology results.                           - Repeat colonoscopy in 5 years for surveillance                            based on pathology results. Mauri Pole, MD 12/15/2016 11:35:44 AM This report has been signed electronically.

## 2016-12-16 ENCOUNTER — Telehealth: Payer: Self-pay

## 2016-12-16 NOTE — Telephone Encounter (Signed)
  Follow up Call-  Call back number 12/15/2016  Post procedure Call Back phone  # [phone]  Permission to leave phone message Yes  Some recent data might be hidden     Patient questions:  Do you have a fever, pain , or abdominal swelling? No. Pain Score  0 *  Have you tolerated food without any problems? Yes.    Have you been able to return to your normal activities? Yes.    Do you have any questions about your discharge instructions: Diet   No. Medications  No. Follow up visit  No.  Do you have questions or concerns about your Care? No.  Actions: * If pain score is 4 or above: No action needed, pain <4.

## 2016-12-21 ENCOUNTER — Encounter: Payer: Self-pay | Admitting: Gastroenterology

## 2017-06-15 ENCOUNTER — Encounter: Payer: Self-pay | Admitting: Internal Medicine

## 2017-06-15 ENCOUNTER — Other Ambulatory Visit: Payer: Managed Care, Other (non HMO)

## 2017-06-15 ENCOUNTER — Ambulatory Visit: Payer: Managed Care, Other (non HMO) | Admitting: Internal Medicine

## 2017-06-15 ENCOUNTER — Ambulatory Visit (INDEPENDENT_AMBULATORY_CARE_PROVIDER_SITE_OTHER)
Admission: RE | Admit: 2017-06-15 | Discharge: 2017-06-15 | Disposition: A | Discharge status: Home / Self Care | Payer: Managed Care, Other (non HMO) | Source: Ambulatory Visit | Attending: Internal Medicine | Admitting: Internal Medicine

## 2017-06-15 VITALS — BP 126/84 | HR 75 | Temp 98.5°F | Ht 61.0 in | Wt 162.0 lb

## 2017-06-15 DIAGNOSIS — M542 Cervicalgia: Secondary | ICD-10-CM | POA: Insufficient documentation

## 2017-06-15 MED ORDER — IBUPROFEN 800 MG PO TABS: 800.0000 mg | ORAL_TABLET | Freq: Three times a day (TID) | ORAL | 0 refills | Status: DC | PRN

## 2017-06-15 MED ORDER — CYCLOBENZAPRINE HCL 5 MG PO TABS: 5.0000 mg | ORAL_TABLET | Freq: Three times a day (TID) | ORAL | 1 refills | Status: DC | PRN

## 2017-06-15 MED ORDER — PREDNISONE 10 MG PO TABS: ORAL_TABLET | ORAL | 0 refills | Status: DC

## 2017-06-15 NOTE — Progress Notes (Signed)
Subjective:    Patient ID: Leslie Beard, female    DOB: 05-18-1975, 42 y.o.   MRN: [id number]  HPI Here with acute visit with post neck pain x 1 wk with post neck middle and right , started with getting up one day with a pop, no recent hevaiy ifitnig and no trauma.  Tried salonpaz x 2 days, worse to move horiz left and right as well as flexion, midl to mod, ocnstant, sharp and stiff like pain Having chronic HA really no change, and no UE pain, weak, numbness. No gait change or falls.   Pt denies fever, wt loss, night sweats, loss of appetite, or other constitutional symptoms Past Medical History:  Diagnosis Date  . Migraine    Past Surgical History:  Procedure Laterality Date  . CESAREAN SECTION      reports that she has never smoked. She has never used smokeless tobacco. She reports that she drinks alcohol. She reports that she does not use drugs. family history includes Asthma in her daughter and son; Bone cancer in her paternal grandfather; Colon cancer (age of onset: 51) in her father; Esophageal cancer in her father; Hypertension in her father and maternal grandmother; Lung cancer in her paternal uncle; Seizures in her paternal grandfather and paternal uncle; Thyroid disease in her paternal grandmother. Allergies  Allergen Reactions  . Sulfa Antibiotics Hives and Other (See Comments)    Throat tightened up   Current Outpatient Medications on File Prior to Visit  Medication Sig Dispense Refill  . cholecalciferol (VITAMIN D) 1000 UNITS tablet Take 2 tablets (2,000 Units total) by mouth daily. 100 tablet 3  . SUMAtriptan (IMITREX) 100 MG tablet Take 1 tablet (100 mg total) by mouth every 2 (two) hours as needed for migraine. (Patient taking differently: Take 100 mg by mouth as needed for migraine. ) 10 tablet 5  . TRI-PREVIFEM 0.18/0.215/0.25 MG-35 MCG tablet Take 1 tablet by mouth daily.  3  . vitamin B-12 (CYANOCOBALAMIN) 1000 MCG tablet Take 1,000 mcg by mouth daily.     No  current facility-administered medications on file prior to visit.    Review of Systems  Constitutional: Negative for other unusual diaphoresis or sweats HENT: Negative for ear discharge or swelling Eyes: Negative for other worsening visual disturbances Respiratory: Negative for stridor or other swelling  Gastrointestinal: Negative for worsening distension or other blood Genitourinary: Negative for retention or other urinary change Musculoskeletal: Negative for other MSK pain or swelling Skin: Negative for color change or other new lesions Neurological: Negative for worsening tremors and other numbness  Psychiatric/Behavioral: Negative for worsening agitation or other fatigue All other system neg per pt    Objective:   Physical Exam BP 126/84   Pulse 75   Temp 98.5 F (36.9 C) (Oral)   Ht 5\' 1"  (1.549 m)   Wt 162 lb (73.5 kg)   LMP 06/01/2017   SpO2 99%   BMI 30.61 kg/m  VS noted,  Constitutional: Pt appears in NAD HENT: Head: NCAT.  Right Ear: External ear normal.  Left Ear: External ear normal.  Eyes: . Pupils are equal, round, and reactive to light. Conjunctivae and EOM are normal Nose: without d/c or deformity Neck: Neck supple. Gross normal ROM Cardiovascular: Normal rate and regular rhythm.   Pulmonary/Chest: Effort normal and breath sounds without rales or wheezing.  Post neck with c spine lowest tender in midline as well as bilat upper back and trapezoid areas Neurological: Pt is alert. At baseline orientation,  motor 5/5 intact Skin: Skin is warm. No rashes, other new lesions, no LE edema Psychiatric: Pt behavior is normal without agitation  No other exam findings    Assessment & Plan:

## 2017-06-15 NOTE — Patient Instructions (Signed)
Please take all new medication as prescribed - the anti-inflammatory, muscle relaxer as needed, and the prednisone  Please continue all other medications as before, and refills have been done if requested.  Please have the pharmacy call with any other refills you may need.  Please keep your appointments with your specialists as you may have planned  Please go to the XRAY Department in the Basement (go straight as you get off the elevator) for the x-ray testing  You will be contacted by phone if any changes need to be made immediately.  Otherwise, you will receive a letter about your results with an explanation, but please check with MyChart first.  Please remember to sign up for MyChart if you have not done so, as this will be important to you in the future with finding out test results, communicating by private email, and scheduling acute appointments online when needed.

## 2017-06-18 NOTE — Assessment & Plan Note (Signed)
Mild to mod, c/w msk strain and/or cant r/o cervical spine DJD/DDD, for nsaid prn, muscle relaxer prn, predoac asd, plain films,  to f/u any worsening symptoms or concerns

## 2018-01-25 ENCOUNTER — Encounter: Payer: Self-pay | Admitting: Internal Medicine

## 2018-01-25 ENCOUNTER — Other Ambulatory Visit (INDEPENDENT_AMBULATORY_CARE_PROVIDER_SITE_OTHER): Payer: Managed Care, Other (non HMO)

## 2018-01-25 ENCOUNTER — Ambulatory Visit: Payer: Managed Care, Other (non HMO) | Admitting: Internal Medicine

## 2018-01-25 DIAGNOSIS — G5601 Carpal tunnel syndrome, right upper limb: Secondary | ICD-10-CM | POA: Diagnosis not present

## 2018-01-25 DIAGNOSIS — E538 Deficiency of other specified B group vitamins: Secondary | ICD-10-CM | POA: Diagnosis not present

## 2018-01-25 DIAGNOSIS — E559 Vitamin D deficiency, unspecified: Secondary | ICD-10-CM

## 2018-01-25 DIAGNOSIS — G5600 Carpal tunnel syndrome, unspecified upper limb: Secondary | ICD-10-CM | POA: Insufficient documentation

## 2018-01-25 LAB — CBC WITH DIFFERENTIAL/PLATELET
Basophils Absolute: 0.1 10*3/uL (ref 0.0–0.1)
Basophils Relative: 0.9 % (ref 0.0–3.0)
Eosinophils Absolute: 0.2 10*3/uL (ref 0.0–0.7)
Eosinophils Relative: 2.8 % (ref 0.0–5.0)
HCT: 42.4 % (ref 36.0–46.0)
Hemoglobin: 14.4 g/dL (ref 12.0–15.0)
Lymphocytes Relative: 33.5 % (ref 12.0–46.0)
Lymphs Abs: 2.5 10*3/uL (ref 0.7–4.0)
MCHC: 33.9 g/dL (ref 30.0–36.0)
MCV: 93.6 fl (ref 78.0–100.0)
Monocytes Absolute: 0.5 10*3/uL (ref 0.1–1.0)
Monocytes Relative: 7.2 % (ref 3.0–12.0)
Neutro Abs: 4.1 10*3/uL (ref 1.4–7.7)
Neutrophils Relative %: 55.6 % (ref 43.0–77.0)
Platelets: 232 10*3/uL (ref 150.0–400.0)
RBC: 4.53 Mil/uL (ref 3.87–5.11)
RDW: 12.8 % (ref 11.5–15.5)
WBC: 7.4 10*3/uL (ref 4.0–10.5)

## 2018-01-25 LAB — BASIC METABOLIC PANEL
BUN: 10 mg/dL (ref 6–23)
CHLORIDE: 101 meq/L (ref 96–112)
CO2: 28 meq/L (ref 19–32)
Calcium: 9.7 mg/dL (ref 8.4–10.5)
Creatinine, Ser: 0.79 mg/dL (ref 0.40–1.20)
GFR: 95.63 mL/min (ref >60.00)
Glucose, Bld: 109 mg/dL — ABNORMAL HIGH (ref 70–99)
Potassium: 3.7 mEq/L (ref 3.5–5.1)
Sodium: 139 mEq/L (ref 135–145)

## 2018-01-25 MED ORDER — MELOXICAM 15 MG PO TABS: 15.0000 mg | ORAL_TABLET | Freq: Every day | ORAL | 2 refills | Status: DC

## 2018-01-25 NOTE — Patient Instructions (Signed)
Carpal Tunnel Syndrome    Carpal tunnel syndrome is a condition that causes pain in your hand and arm. The carpal tunnel is a narrow area that is on the palm side of your wrist. Repeated wrist motion or certain diseases may cause swelling in the tunnel. This swelling can pinch the main nerve in the wrist (median nerve).  What are the causes?  This condition may be caused by:   Repeated wrist motions.   Wrist injuries.   Arthritis.   A sac of fluid (cyst) or abnormal growth (tumor) in the carpal tunnel.   Fluid buildup during pregnancy.  Sometimes the cause is not known.  What increases the risk?  The following factors may make you more likely to develop this condition:   Having a job in which you move your wrist in the same way many times. This includes jobs like being a butcher or a cashier.   Being a woman.   Having other health conditions, such as:  ? Diabetes.  ? Obesity.  ? A thyroid gland that is not active enough (hypothyroidism).  ? Kidney failure.  What are the signs or symptoms?  Symptoms of this condition include:   A tingling feeling in your fingers.   Tingling or a loss of feeling (numbness) in your hand.   Pain in your entire arm. This pain may get worse when you bend your wrist and elbow for a long time.   Pain in your wrist that goes up your arm to your shoulder.   Pain that goes down into your palm or fingers.   A weak feeling in your hands. You may find it hard to grab and hold items.  You may feel worse at night.  How is this diagnosed?  This condition is diagnosed with a medical history and physical exam. You may also have tests, such as:   Electromyogram (EMG). This test checks the signals that the nerves send to the muscles.   Nerve conduction study. This test checks how well signals pass through your nerves.   Imaging tests, such as X-rays, ultrasound, and MRI. These tests check for what might be the cause of your condition.  How is this treated?  This condition may be treated  with:   Lifestyle changes. You will be asked to stop or change the activity that caused your problem.   Doing exercise and activities that make bones and muscles stronger (physical therapy).   Learning how to use your hand again (occupational therapy).   Medicines for pain and swelling (inflammation). You may have injections in your wrist.   A wrist splint.   Surgery.  Follow these instructions at home:  If you have a splint:   Wear the splint as told by your doctor. Remove it only as told by your doctor.   Loosen the splint if your fingers:  ? Tingle.  ? Lose feeling (become numb).  ? Turn cold and blue.   Keep the splint clean.   If the splint is not waterproof:  ? Do not let it get wet.  ? Cover it with a watertight covering when you take a bath or a shower.  Managing pain, stiffness, and swelling     If told, put ice on the painful area:  ? If you have a removable splint, remove it as told by your doctor.  ? Put ice in a plastic bag.  ? Place a towel between your skin and the bag.  ? Leave the   ice on for 20 minutes, 2-3 times per day.  General instructions   Take over-the-counter and prescription medicines only as told by your doctor.   Rest your wrist from any activity that may cause pain. If needed, talk with your boss at work about changes that can help your wrist heal.   Do any exercises as told by your doctor, physical therapist, or occupational therapist.   Keep all follow-up visits as told by your doctor. This is important.  Contact a doctor if:   You have new symptoms.   Medicine does not help your pain.   Your symptoms get worse.  Get help right away if:   You have very bad numbness or tingling in your wrist or hand.  Summary   Carpal tunnel syndrome is a condition that causes pain in your hand and arm.   It is often caused by repeated wrist motions.   Lifestyle changes and medicines are used to treat this problem. Surgery may help in very bad cases.   Follow your doctor's  instructions about wearing a splint, resting your wrist, keeping follow-up visits, and calling for help.  This information is not intended to replace advice given to you by your health care provider. Make sure you discuss any questions you have with your health care provider.  Document Released: 12/16/2010 Document Revised: 05/05/2017 Document Reviewed: 05/05/2017  Elsevier Interactive Patient Education  2019 [employer]

## 2018-01-25 NOTE — Assessment & Plan Note (Signed)
Labs

## 2018-01-25 NOTE — Assessment & Plan Note (Signed)
Vit D 

## 2018-01-25 NOTE — Assessment & Plan Note (Signed)
Work related Brace Meloxicam po Labs

## 2018-01-25 NOTE — Progress Notes (Signed)
Subjective:  Patient ID: Leslie Beard, female    DOB: 05/04/1975  Age: 43 y.o. MRN: [id number]  CC: No chief complaint on file.   HPI Leslie Beard presents for R arm pain x 1 week worse w/writing, typing - starts in the mid palm and up to the elbow. It is worse at work C/o tingling+  Outpatient Medications Prior to Visit  Medication Sig Dispense Refill  . cholecalciferol (VITAMIN D) 1000 UNITS tablet Take 2 tablets (2,000 Units total) by mouth daily. 100 tablet 3  . cyclobenzaprine (FLEXERIL) 5 MG tablet Take 1 tablet (5 mg total) by mouth 3 (three) times daily as needed for muscle spasms. 40 tablet 1  . ibuprofen (ADVIL,MOTRIN) 800 MG tablet Take 1 tablet (800 mg total) by mouth every 8 (eight) hours as needed. 40 tablet 0  . SUMAtriptan (IMITREX) 100 MG tablet Take 1 tablet (100 mg total) by mouth every 2 (two) hours as needed for migraine. (Patient taking differently: Take 100 mg by mouth as needed for migraine. ) 10 tablet 5  . vitamin B-12 (CYANOCOBALAMIN) 1000 MCG tablet Take 1,000 mcg by mouth daily.    . predniSONE (DELTASONE) 10 MG tablet 2 tabs by mouth per day for 5 days 10 tablet 0  . TRI-PREVIFEM 0.18/0.215/0.25 MG-35 MCG tablet Take 1 tablet by mouth daily.  3   No facility-administered medications prior to visit.     ROS: Review of Systems  Constitutional: Negative for activity change, appetite change, chills, fatigue and unexpected weight change.  HENT: Negative for congestion, mouth sores and sinus pressure.   Eyes: Negative for visual disturbance.  Respiratory: Negative for cough and chest tightness.   Gastrointestinal: Negative for abdominal pain and nausea.  Genitourinary: Negative for difficulty urinating, frequency and vaginal pain.  Musculoskeletal: Negative for back pain, gait problem, neck pain and neck stiffness.  Skin: Negative for pallor and rash.  Neurological: Negative for dizziness, tremors, weakness, numbness and headaches.    Psychiatric/Behavioral: Negative for confusion and sleep disturbance.    Objective:  BP 128/82 (BP Location: Left Arm, Patient Position: Sitting, Cuff Size: Normal)   Pulse 69   Temp 98.7 F (37.1 C) (Oral)   Ht 5\' 1"  (1.549 m)   Wt 145 lb (65.8 kg)   SpO2 98%   BMI 27.40 kg/m   BP Readings from Last 3 Encounters:  01/25/18 128/82  06/15/17 126/84  12/15/16 122/77    Wt Readings from Last 3 Encounters:  01/25/18 145 lb (65.8 kg)  06/15/17 162 lb (73.5 kg)  12/15/16 167 lb (75.8 kg)    Physical Exam Constitutional:      General: She is not in acute distress.    Appearance: She is well-developed.  HENT:     Head: Normocephalic.     Right Ear: External ear normal.     Left Ear: External ear normal.     Nose: Nose normal.  Eyes:     General:        Right eye: No discharge.        Left eye: No discharge.     Conjunctiva/sclera: Conjunctivae normal.     Pupils: Pupils are equal, round, and reactive to light.  Neck:     Musculoskeletal: Normal range of motion and neck supple.     Thyroid: No thyromegaly.     Vascular: No JVD.     Trachea: No tracheal deviation.  Cardiovascular:     Rate and Rhythm: Normal rate and regular rhythm.  Heart sounds: Normal heart sounds.  Pulmonary:     Effort: No respiratory distress.     Breath sounds: No stridor. No wheezing.  Abdominal:     General: Bowel sounds are normal. There is no distension.     Palpations: Abdomen is soft. There is no mass.     Tenderness: There is no abdominal tenderness. There is no guarding or rebound.  Musculoskeletal:        General: Tenderness present.  Lymphadenopathy:     Cervical: No cervical adenopathy.  Skin:    Findings: No erythema or rash.  Neurological:     Cranial Nerves: No cranial nerve deficit.     Motor: No abnormal muscle tone.     Coordination: Coordination normal.     Deep Tendon Reflexes: Reflexes normal.  Psychiatric:        Behavior: Behavior normal.        Thought  Content: Thought content normal.        Judgment: Judgment normal.   CTS signs (+) R MS 5/5. No atrophy  Lab Results  Component Value Date   WBC 7.1 07/05/2016   HGB 13.6 07/05/2016   HCT 39.7 07/05/2016   PLT 260.0 07/05/2016   GLUCOSE 96 07/05/2016   CHOL 191 07/05/2016   TRIG 60.0 07/05/2016   HDL 56.40 07/05/2016   LDLCALC 123 (H) 07/05/2016   ALT 9 07/05/2016   AST 14 07/05/2016   NA 139 07/05/2016   K 4.1 07/05/2016   CL 104 07/05/2016   CREATININE 0.77 07/05/2016   BUN 16 07/05/2016   CO2 27 07/05/2016   TSH 1.12 07/05/2016    Dg Cervical Spine Complete  Result Date: 06/15/2017 CLINICAL DATA:  Neck pain and stiffness for 1 week. The patient reports a lump on the posterior aspect of the lower neck. EXAM: CERVICAL SPINE - COMPLETE 4+ VIEW COMPARISON:  None. FINDINGS: There is no evidence of cervical spine fracture or prevertebral soft tissue swelling. Alignment is normal. No other significant bone abnormalities are identified. Prominent fatty density the posterior aspect of the neck is identified. IMPRESSION: Normal cervical spine. Fatty prominence of soft tissues of the posterior neck may account for the patient's palpated abnormality. Electronically Signed   By: Inge Rise M.D.   On: 06/15/2017 15:38    Assessment & Plan:   There are no diagnoses linked to this encounter.   No orders of the defined types were placed in this encounter.    Follow-up: No follow-ups on file.  Walker Kehr, MD

## 2018-01-26 LAB — T4, FREE: Free T4: 0.85 ng/dL (ref 0.60–1.60)

## 2018-01-26 LAB — VITAMIN B12: Vitamin B-12: 247 pg/mL (ref 211–911)

## 2018-01-26 LAB — TSH: TSH: 1.24 u[IU]/mL (ref 0.35–4.50)

## 2018-04-11 ENCOUNTER — Encounter: Payer: Self-pay | Admitting: Internal Medicine

## 2018-04-11 ENCOUNTER — Other Ambulatory Visit: Payer: Self-pay

## 2018-04-11 ENCOUNTER — Ambulatory Visit: Payer: Managed Care, Other (non HMO) | Admitting: Internal Medicine

## 2018-04-11 DIAGNOSIS — H6991 Unspecified Eustachian tube disorder, right ear: Secondary | ICD-10-CM | POA: Diagnosis not present

## 2018-04-11 MED ORDER — AZITHROMYCIN 250 MG PO TABS: ORAL_TABLET | ORAL | 0 refills | Status: DC

## 2018-04-11 NOTE — Assessment & Plan Note (Signed)
Afrin, Zyrtec Pac if worse

## 2018-04-11 NOTE — Patient Instructions (Addendum)
Use Afrin, Zyrtec   Eustachian Tube Dysfunction  Eustachian tube dysfunction refers to a condition in which a blockage develops in the narrow passage that connects the middle ear to the back of the nose (eustachian tube). The eustachian tube regulates air pressure in the middle ear by letting air move between the ear and nose. It also helps to drain fluid from the middle ear space. Eustachian tube dysfunction can affect one or both ears. When the eustachian tube does not function properly, air pressure, fluid, or both can build up in the middle ear. What are the causes? This condition occurs when the eustachian tube becomes blocked or cannot open normally. Common causes of this condition include:  Ear infections.  Colds and other infections that affect the nose, mouth, and throat (upper respiratory tract).  Allergies.  Irritation from cigarette smoke.  Irritation from stomach acid coming up into the esophagus (gastroesophageal reflux). The esophagus is the tube that carries food from the mouth to the stomach.  Sudden changes in air pressure, such as from descending in an airplane or scuba diving.  Abnormal growths in the nose or throat, such as: ? Growths that line the nose (nasal polyps). ? Abnormal growth of cells (tumors). ? Enlarged tissue at the back of the throat (adenoids). What increases the risk? You are more likely to develop this condition if:  You smoke.  You are overweight.  You are a child who has: ? Certain birth defects of the mouth, such as cleft palate. ? Large tonsils or adenoids. What are the signs or symptoms? Common symptoms of this condition include:  A feeling of fullness in the ear.  Ear pain.  Clicking or popping noises in the ear.  Ringing in the ear.  Hearing loss.  Loss of balance.  Dizziness. Symptoms may get worse when the air pressure around you changes, such as when you travel to an area of high elevation, fly on an airplane, or go  scuba diving. How is this diagnosed? This condition may be diagnosed based on:  Your symptoms.  A physical exam of your ears, nose, and throat.  Tests, such as those that measure: ? The movement of your eardrum (tympanogram). ? Your hearing (audiometry). How is this treated? Treatment depends on the cause and severity of your condition.  In mild cases, you may relieve your symptoms by moving air into your ears. This is called "popping the ears."  In more severe cases, or if you have symptoms of fluid in your ears, treatment may include: ? Medicines to relieve congestion (decongestants). ? Medicines that treat allergies (antihistamines). ? Nasal sprays or ear drops that contain medicines that reduce swelling (steroids). ? A procedure to drain the fluid in your eardrum (myringotomy). In this procedure, a small tube is placed in the eardrum to:  Drain the fluid.  Restore the air in the middle ear space. ? A procedure to insert a balloon device through the nose to inflate the opening of the eustachian tube (balloon dilation). Follow these instructions at home: Lifestyle  Do not do any of the following until your health care provider approves: ? Travel to high altitudes. ? Fly in airplanes. ? Work in a Pension scheme manager or room. ? Scuba dive.  Do not use any products that contain nicotine or tobacco, such as cigarettes and e-cigarettes. If you need help quitting, ask your health care provider.  Keep your ears dry. Wear fitted earplugs during showering and bathing. Dry your ears completely after.  General instructions  Take over-the-counter and prescription medicines only as told by your health care provider.  Use techniques to help pop your ears as recommended by your health care provider. These may include: ? Chewing gum. ? Yawning. ? Frequent, forceful swallowing. ? Closing your mouth, holding your nose closed, and gently blowing as if you are trying to blow air out of your  nose.  Keep all follow-up visits as told by your health care provider. This is important. Contact a health care provider if:  Your symptoms do not go away after treatment.  Your symptoms come back after treatment.  You are unable to pop your ears.  You have: ? A fever. ? Pain in your ear. ? Pain in your head or neck. ? Fluid draining from your ear.  Your hearing suddenly changes.  You become very dizzy.  You lose your balance. Summary  Eustachian tube dysfunction refers to a condition in which a blockage develops in the eustachian tube.  It can be caused by ear infections, allergies, inhaled irritants, or abnormal growths in the nose or throat.  Symptoms include ear pain, hearing loss, or ringing in the ears.  Mild cases are treated with maneuvers to unblock the ears, such as yawning or ear popping.  Severe cases are treated with medicines. Surgery may also be done (rare). This information is not intended to replace advice given to you by your health care provider. Make sure you discuss any questions you have with your health care provider. Document Released: 01/23/2015 Document Revised: 04/18/2017 Document Reviewed: 04/18/2017 Elsevier Interactive Patient Education  2019 [employer].

## 2018-04-11 NOTE — Progress Notes (Signed)
Subjective:  Patient ID: Leslie Beard, female    DOB: 1975-10-31  Age: 43 y.o. MRN: [id number]  CC: No chief complaint on file.   HPI Leslie Beard presents for R ear discomfort x 1 week  Outpatient Medications Prior to Visit  Medication Sig Dispense Refill  . cholecalciferol (VITAMIN D) 1000 UNITS tablet Take 2 tablets (2,000 Units total) by mouth daily. 100 tablet 3  . cyclobenzaprine (FLEXERIL) 5 MG tablet Take 1 tablet (5 mg total) by mouth 3 (three) times daily as needed for muscle spasms. 40 tablet 1  . meloxicam (MOBIC) 15 MG tablet Take 1 tablet (15 mg total) by mouth daily. 30 tablet 2  . SUMAtriptan (IMITREX) 100 MG tablet Take 1 tablet (100 mg total) by mouth every 2 (two) hours as needed for migraine. (Patient taking differently: Take 100 mg by mouth as needed for migraine. ) 10 tablet 5  . vitamin B-12 (CYANOCOBALAMIN) 1000 MCG tablet Take 1,000 mcg by mouth daily.     No facility-administered medications prior to visit.     ROS: Review of Systems  Constitutional: Negative for activity change, appetite change, chills, fatigue and unexpected weight change.  HENT: Positive for ear pain. Negative for congestion, ear discharge, hearing loss, mouth sores, sinus pressure, sinus pain and tinnitus.   Eyes: Negative for visual disturbance.  Respiratory: Negative for cough and chest tightness.   Gastrointestinal: Negative for abdominal pain and nausea.  Genitourinary: Negative for difficulty urinating, frequency and vaginal pain.  Musculoskeletal: Negative for back pain and gait problem.  Skin: Negative for pallor and rash.  Neurological: Negative for dizziness, tremors, weakness, numbness and headaches.  Psychiatric/Behavioral: Negative for confusion and sleep disturbance.    Objective:  BP 126/82 (BP Location: Left Arm, Patient Position: Sitting, Cuff Size: Normal)   Pulse 66   Temp 98.3 F (36.8 C) (Oral)   Ht 5\' 1"  (1.549 m)   Wt 142 lb (64.4 kg)   SpO2 99%   BMI  26.83 kg/m   BP Readings from Last 3 Encounters:  04/11/18 126/82  01/25/18 128/82  06/15/17 126/84    Wt Readings from Last 3 Encounters:  04/11/18 142 lb (64.4 kg)  01/25/18 145 lb (65.8 kg)  06/15/17 162 lb (73.5 kg)    Physical Exam Constitutional:      General: She is not in acute distress.    Appearance: She is well-developed.  HENT:     Head: Normocephalic.     Right Ear: Tympanic membrane, ear canal and external ear normal. There is no impacted cerumen.     Left Ear: Tympanic membrane, ear canal and external ear normal. There is no impacted cerumen.     Nose: Nose normal.     Mouth/Throat:     Pharynx: Oropharynx is clear.  Eyes:     General:        Right eye: No discharge.        Left eye: No discharge.     Conjunctiva/sclera: Conjunctivae normal.     Pupils: Pupils are equal, round, and reactive to light.  Neck:     Musculoskeletal: Normal range of motion and neck supple.     Thyroid: No thyromegaly.     Vascular: No JVD.     Trachea: No tracheal deviation.  Cardiovascular:     Rate and Rhythm: Normal rate and regular rhythm.     Heart sounds: Normal heart sounds.  Pulmonary:     Effort: No respiratory distress.  Breath sounds: No stridor. No wheezing.  Abdominal:     General: Bowel sounds are normal. There is no distension.     Palpations: Abdomen is soft. There is no mass.     Tenderness: There is no abdominal tenderness. There is no guarding or rebound.  Musculoskeletal:        General: No tenderness.  Lymphadenopathy:     Cervical: No cervical adenopathy.  Skin:    Findings: No erythema or rash.  Neurological:     Cranial Nerves: No cranial nerve deficit.     Motor: No abnormal muscle tone.     Coordination: Coordination normal.     Deep Tendon Reflexes: Reflexes normal.  Psychiatric:        Behavior: Behavior normal.        Thought Content: Thought content normal.        Judgment: Judgment normal.   B TMJ nl  Lab Results  Component  Value Date   WBC 7.4 01/25/2018   HGB 14.4 01/25/2018   HCT 42.4 01/25/2018   PLT 232.0 01/25/2018   GLUCOSE 109 (H) 01/25/2018   CHOL 191 07/05/2016   TRIG 60.0 07/05/2016   HDL 56.40 07/05/2016   LDLCALC 123 (H) 07/05/2016   ALT 9 07/05/2016   AST 14 07/05/2016   NA 139 01/25/2018   K 3.7 01/25/2018   CL 101 01/25/2018   CREATININE 0.79 01/25/2018   BUN 10 01/25/2018   CO2 28 01/25/2018   TSH 1.24 01/25/2018    Dg Cervical Spine Complete  Result Date: 06/15/2017 CLINICAL DATA:  Neck pain and stiffness for 1 week. The patient reports a lump on the posterior aspect of the lower neck. EXAM: CERVICAL SPINE - COMPLETE 4+ VIEW COMPARISON:  None. FINDINGS: There is no evidence of cervical spine fracture or prevertebral soft tissue swelling. Alignment is normal. No other significant bone abnormalities are identified. Prominent fatty density the posterior aspect of the neck is identified. IMPRESSION: Normal cervical spine. Fatty prominence of soft tissues of the posterior neck may account for the patient's palpated abnormality. Electronically Signed   By: Inge Rise M.D.   On: 06/15/2017 15:38    Assessment & Plan:   There are no diagnoses linked to this encounter.   No orders of the defined types were placed in this encounter.    Follow-up: No follow-ups on file.  Walker Kehr, MD

## 2018-08-13 ENCOUNTER — Encounter: Payer: Self-pay | Admitting: Internal Medicine

## 2018-08-13 ENCOUNTER — Ambulatory Visit (INDEPENDENT_AMBULATORY_CARE_PROVIDER_SITE_OTHER): Payer: Managed Care, Other (non HMO) | Admitting: Internal Medicine

## 2018-08-13 DIAGNOSIS — G500 Trigeminal neuralgia: Secondary | ICD-10-CM | POA: Diagnosis not present

## 2018-08-13 MED ORDER — VALACYCLOVIR HCL 1 G PO TABS: 1000.0000 mg | ORAL_TABLET | Freq: Three times a day (TID) | ORAL | 0 refills | Status: DC

## 2018-08-13 MED ORDER — IBUPROFEN 600 MG PO TABS: ORAL_TABLET | ORAL | 1 refills | Status: DC

## 2018-08-13 NOTE — Assessment & Plan Note (Signed)
Probable Ibuprofen Rx Valtrex A/c duct deflector

## 2018-08-13 NOTE — Progress Notes (Signed)
Virtual Visit via Video Note  I connected with Leslie Beard on 08/13/18 at  2:00 PM EDT by a video enabled telemedicine application and verified that I am speaking with the correct person using two identifiers. Poor connection - we had to switch to a call subsequently   I discussed the limitations of evaluation and management by telemedicine and the availability of in person appointments. The patient expressed understanding and agreed to proceed.  History of Present Illness: C/o R HA since Fri  There has been no runny nose, cough, chest pain, shortness of breath, abdominal pain, diarrhea, constipation, arthralgias, skin rashes.   Observations/Objective: The patient appears to be in no acute distress, sounds well.  Assessment and Plan:  See my Assessment and Plan. Follow Up Instructions:    I discussed the assessment and treatment plan with the patient. The patient was provided an opportunity to ask questions and all were answered. The patient agreed with the plan and demonstrated an understanding of the instructions.   The patient was advised to call back or seek an in-person evaluation if the symptoms worsen or if the condition fails to improve as anticipated.  I provided face-to-face time during this encounter. We were at different locations.   Walker Kehr, MD

## 2018-08-13 NOTE — Patient Instructions (Signed)
Trigeminal Neuralgia  Trigeminal neuralgia is a nerve disorder that causes severe pain on one side of the face. The pain may last from a few seconds to several minutes. The pain is usually only on one side of the face. Symptoms may occur for days, weeks, or months and then go away for months or years. The pain may return and be worse than before. What are the causes? This condition is caused by damage or pressure to a nerve in the head that is called the trigeminal nerve. An attack can be triggered by:  Talking.  Chewing.  Putting on makeup.  Washing your face.  Shaving your face.  Brushing your teeth.  Touching your face. What increases the risk? You are more likely to develop this condition if you:  Are 45 years of age or older.  Are female. What are the signs or symptoms? The main symptom of this condition is severe pain in the:  Jaw.  Lips.  Eyes.  Nose.  Scalp.  Forehead.  Face. The pain may be:  Intense.  Stabbing.  Electric.  Shock-like. How is this diagnosed? This condition is diagnosed with a physical exam. A CT scan or an MRI may be done to rule out other conditions that can cause facial pain. How is this treated? This condition may be treated with:  Avoiding the things that trigger your symptoms.  Taking prescription medicines (anticonvulsants).  Having surgery. This may be done in severe cases if other medical treatment does not provide relief.  Having procedures such as ablation, thermal, or radiation therapy. It may take up to one month for treatment to start relieving the pain. Follow these instructions at home: Managing pain  Learn as much as you can about how to manage your pain. Ask your health care provider if a pain specialist would be helpful.  Consider talking with a mental health care provider (psychologist) about how to cope with the pain.  Consider joining a pain support group. General instructions  Take  over-the-counter and prescription medicines only as told by your health care provider.  Avoid the things that trigger your symptoms. It may help to: ? Chew on the unaffected side of your mouth. ? Avoid touching your face. ? Avoid blasts of hot or cold air.  Follow your treatment plan as told by your health care provider. This may include: ? Cognitive or behavioral therapy. ? Gentle, regular exercise. ? Meditation or yoga. ? Aromatherapy.  Keep all follow-up visits as told by your health care provider. You may need to be monitored closely to make sure treatment is working well for you. Where to find more information  F[employer] Association: fpa-support.org Contact a health care provider if:  Your medicine is not helping your symptoms.  You have side effects from the medicine used for treatment.  You develop new, unexplained symptoms, such as: ? Double vision. ? Facial weakness. ? Facial numbness. ? Changes in hearing or balance.  You feel depressed. Get help right away if:  Your pain is severe and is not getting better.  You develop suicidal thoughts. If you ever feel like you may hurt yourself or others, or have thoughts about taking your own life, get help right away. You can go to your nearest emergency department or call:  Your local emergency services (911 in the [place]  A suicide crisis helpline, such as the National Suicide Prevention Lifeline at 1-800-273-8255. This is open 24 hours a day. Summary  Trigeminal neuralgia is a   nerve disorder that causes severe pain on one side of the face. The pain may last from a few seconds to several minutes.  This condition is caused by damage or pressure to a nerve in the head that is called the trigeminal nerve.  Treatment may include avoiding the things that trigger your symptoms, taking medicines, or having surgery or procedures. It may take up to one month for treatment to start relieving the pain.  Avoid the things that  trigger your symptoms.  Keep all follow-up visits as told by your health care provider. You may need to be monitored closely to make sure treatment is working well for you. This information is not intended to replace advice given to you by your health care provider. Make sure you discuss any questions you have with your health care provider. Document Released: 12/25/1999 Document Revised: 11/13/2017 Document Reviewed: 11/13/2017 Elsevier Patient Education  2020 [employer]  

## 2018-08-21 ENCOUNTER — Telehealth: Payer: Self-pay | Admitting: Internal Medicine

## 2018-08-21 NOTE — Telephone Encounter (Signed)
MD is out of the office pls advise../lmb 

## 2018-08-21 NOTE — Telephone Encounter (Signed)
Relation to pt: self  Call back number: [phone]    Reason for call:  Patient was seen 08/13/2018 by PCP and states she's no longer experiencing sharp head aches but head aches have not improved, patient stats ibuprofen (ADVIL) 600 MG tablet and valACYclovir (VALTREX) 1000 MG tablet is not working, patient seeking clinical advice

## 2018-08-22 NOTE — Telephone Encounter (Signed)
In office OV scheduled with Dr. Jenny Reichmann 08/23/18.

## 2018-08-22 NOTE — Telephone Encounter (Signed)
She needs to see someone in person  TJ

## 2018-08-23 ENCOUNTER — Ambulatory Visit (INDEPENDENT_AMBULATORY_CARE_PROVIDER_SITE_OTHER): Payer: Managed Care, Other (non HMO) | Admitting: Internal Medicine

## 2018-08-23 ENCOUNTER — Other Ambulatory Visit: Payer: Self-pay

## 2018-08-23 DIAGNOSIS — R51 Headache: Secondary | ICD-10-CM

## 2018-08-23 DIAGNOSIS — R519 Headache, unspecified: Secondary | ICD-10-CM

## 2018-08-23 MED ORDER — CARBAMAZEPINE ER 100 MG PO CP12: 100.0000 mg | ORAL_CAPSULE | Freq: Two times a day (BID) | ORAL | 5 refills | Status: DC

## 2018-08-23 NOTE — Progress Notes (Signed)
Subjective:    Patient ID: Leslie Beard, female    DOB: 1975/11/23, 43 y.o.   MRN: [id number]  HPI  Here to f/u recent televisit with PCP;  Has hx of migraine and chronic recurrent HA, but more recently with somewhat different lightning right facial pains that "stop you in your tracks", and this AM not so bad with the lightning but still with persistent HA despite ibuprofen and valtrex tid; out of imitrex for now, and no longer taking muscle relaxer.  Dx with trigeminal neuralgia at last visit but today pt states most pain is right hemispheric, not facial Pt denies chest pain, increased sob or doe, wheezing, orthopnea, PND, increased LE swelling, palpitations, dizziness or syncope.   Pt denies polydipsia, polyuria   Past Medical History:  Diagnosis Date  . Migraine    Past Surgical History:  Procedure Laterality Date  . CESAREAN SECTION      reports that she has never smoked. She has never used smokeless tobacco. She reports current alcohol use. She reports that she does not use drugs. family history includes Asthma in her daughter and son; Bone cancer in her paternal grandfather; Colon cancer (age of onset: 17) in her father; Esophageal cancer in her father; Hypertension in her father and maternal grandmother; Lung cancer in her paternal uncle; Seizures in her paternal grandfather and paternal uncle; Thyroid disease in her paternal grandmother. Allergies  Allergen Reactions  . Sulfa Antibiotics Hives and Other (See Comments)    Throat tightened up   Current Outpatient Medications on File Prior to Visit  Medication Sig Dispense Refill  . cholecalciferol (VITAMIN D) 1000 UNITS tablet Take 2 tablets (2,000 Units total) by mouth daily. 100 tablet 3  . ibuprofen (ADVIL) 600 MG tablet Take tid x 3 days, then tid prn pain 60 tablet 1  . meloxicam (MOBIC) 15 MG tablet Take 1 tablet (15 mg total) by mouth daily. 30 tablet 2  . SUMAtriptan (IMITREX) 100 MG tablet Take 1 tablet (100 mg total) by  mouth every 2 (two) hours as needed for migraine. (Patient taking differently: Take 100 mg by mouth as needed for migraine. ) 10 tablet 5  . valACYclovir (VALTREX) 1000 MG tablet Take 1 tablet (1,000 mg total) by mouth 3 (three) times daily. 21 tablet 0  . vitamin B-12 (CYANOCOBALAMIN) 1000 MCG tablet Take 1,000 mcg by mouth daily.    . cyclobenzaprine (FLEXERIL) 5 MG tablet Take 1 tablet (5 mg total) by mouth 3 (three) times daily as needed for muscle spasms. (Patient not taking: Reported on 08/23/2018) 40 tablet 1   No current facility-administered medications on file prior to visit.    Review of Systems  Constitutional: Negative for other unusual diaphoresis or sweats HENT: Negative for ear discharge or swelling Eyes: Negative for other worsening visual disturbances Respiratory: Negative for stridor or other swelling  Gastrointestinal: Negative for worsening distension or other blood Genitourinary: Negative for retention or other urinary change Musculoskeletal: Negative for other MSK pain or swelling Skin: Negative for color change or other new lesions Neurological: Negative for worsening tremors and other numbness  Psychiatric/Behavioral: Negative for worsening agitation or other fatigue All other system neg per pt    Objective:   Physical Exam BP 128/80 (BP Location: Left Arm, Patient Position: Sitting, Cuff Size: Normal)   Pulse 79   Temp 98.2 F (36.8 C) (Oral)   Ht 5\' 1"  (1.549 m)   Wt 144 lb 6.4 oz (65.5 kg)   SpO2 99%  BMI 27.28 kg/m  VS noted,  Constitutional: Pt appears in NAD HENT: Head: NCAT.  Right Ear: External ear normal.  Left Ear: External ear normal.  Eyes: . Pupils are equal, round, and reactive to light. Conjunctivae and EOM are normal Nose: without d/c or deformity Neck: Neck supple. Gross normal ROM Cardiovascular: Normal rate and regular rhythm.   Pulmonary/Chest: Effort normal and breath sounds without rales or wheezing.  Abd:  Soft, NT, ND, + BS, no  organomegaly Neurological: Pt is alert. At baseline orientation, motor 5/5 intact, cn 2- 12 intact Skin: Skin is warm. No rashes, other new lesions, no LE edema Psychiatric: Pt behavior is normal without agitation  No other exam findings Lab Results  Component Value Date   WBC 7.4 01/25/2018   HGB 14.4 01/25/2018   HCT 42.4 01/25/2018   PLT 232.0 01/25/2018   GLUCOSE 109 (H) 01/25/2018   CHOL 191 07/05/2016   TRIG 60.0 07/05/2016   HDL 56.40 07/05/2016   LDLCALC 123 (H) 07/05/2016   ALT 9 07/05/2016   AST 14 07/05/2016   NA 139 01/25/2018   K 3.7 01/25/2018   CL 101 01/25/2018   CREATININE 0.79 01/25/2018   BUN 10 01/25/2018   CO2 28 01/25/2018   TSH 1.24 01/25/2018        Assessment & Plan:

## 2018-08-23 NOTE — Patient Instructions (Signed)
Please take all new medication as prescribed - the tegretol  Please continue all other medications as before, and refills have been done if requested.  Please have the pharmacy call with any other refills you may need.  Please continue your efforts at being more active, low cholesterol diet, and weight control.  Please keep your appointments with your specialists as you may have planned  Please follow up with your PCP or consider neurology referral if not improved

## 2018-08-26 ENCOUNTER — Encounter: Payer: Self-pay | Admitting: Internal Medicine

## 2018-08-26 NOTE — Assessment & Plan Note (Signed)
?   Atypical neuritic, ok to d/c the valtrex and nsaid prn, for trial tegretol asd,  to f/u any worsening symptoms or concerns

## 2018-09-19 ENCOUNTER — Other Ambulatory Visit (INDEPENDENT_AMBULATORY_CARE_PROVIDER_SITE_OTHER): Payer: Managed Care, Other (non HMO)

## 2018-09-19 ENCOUNTER — Encounter: Payer: Self-pay | Admitting: Internal Medicine

## 2018-09-19 ENCOUNTER — Other Ambulatory Visit: Payer: Self-pay

## 2018-09-19 ENCOUNTER — Ambulatory Visit (INDEPENDENT_AMBULATORY_CARE_PROVIDER_SITE_OTHER): Payer: Managed Care, Other (non HMO) | Admitting: Internal Medicine

## 2018-09-19 DIAGNOSIS — E538 Deficiency of other specified B group vitamins: Secondary | ICD-10-CM | POA: Diagnosis not present

## 2018-09-19 DIAGNOSIS — E559 Vitamin D deficiency, unspecified: Secondary | ICD-10-CM

## 2018-09-19 DIAGNOSIS — G43109 Migraine with aura, not intractable, without status migrainosus: Secondary | ICD-10-CM

## 2018-09-19 DIAGNOSIS — R51 Headache: Secondary | ICD-10-CM

## 2018-09-19 DIAGNOSIS — G4452 New daily persistent headache (NDPH): Secondary | ICD-10-CM | POA: Diagnosis not present

## 2018-09-19 DIAGNOSIS — R519 Headache, unspecified: Secondary | ICD-10-CM

## 2018-09-19 DIAGNOSIS — R030 Elevated blood-pressure reading, without diagnosis of hypertension: Secondary | ICD-10-CM

## 2018-09-19 LAB — VITAMIN B12: Vitamin B-12: 316 pg/mL (ref 211–911)

## 2018-09-19 LAB — BASIC METABOLIC PANEL
BUN: 13 mg/dL (ref 6–23)
CO2: 28 mEq/L (ref 19–32)
Calcium: 9.4 mg/dL (ref 8.4–10.5)
Chloride: 102 mEq/L (ref 96–112)
Creatinine, Ser: 0.76 mg/dL (ref 0.40–1.20)
GFR: 99.7 mL/min (ref >60.00)
Glucose, Bld: 84 mg/dL (ref 70–99)
Potassium: 3.8 mEq/L (ref 3.5–5.1)
Sodium: 138 mEq/L (ref 135–145)

## 2018-09-19 LAB — CBC WITH DIFFERENTIAL/PLATELET
Basophils Absolute: 0.1 10*3/uL (ref 0.0–0.1)
Basophils Relative: 1.3 % (ref 0.0–3.0)
Eosinophils Absolute: 0.2 10*3/uL (ref 0.0–0.7)
Eosinophils Relative: 3.1 % (ref 0.0–5.0)
HCT: 39.8 % (ref 36.0–46.0)
Hemoglobin: 13.5 g/dL (ref 12.0–15.0)
Lymphocytes Relative: 29.8 % (ref 12.0–46.0)
Lymphs Abs: 2 10*3/uL (ref 0.7–4.0)
MCHC: 33.9 g/dL (ref 30.0–36.0)
MCV: 93.6 fl (ref 78.0–100.0)
Monocytes Absolute: 0.6 10*3/uL (ref 0.1–1.0)
Monocytes Relative: 8.7 % (ref 3.0–12.0)
Neutro Abs: 3.8 10*3/uL (ref 1.4–7.7)
Neutrophils Relative %: 57.1 % (ref 43.0–77.0)
Platelets: 222 10*3/uL (ref 150.0–400.0)
RBC: 4.25 Mil/uL (ref 3.87–5.11)
RDW: 13 % (ref 11.5–15.5)
WBC: 6.7 10*3/uL (ref 4.0–10.5)

## 2018-09-19 LAB — T4, FREE: Free T4: 0.83 ng/dL (ref 0.60–1.60)

## 2018-09-19 LAB — TSH: TSH: 1.79 u[IU]/mL (ref 0.35–4.50)

## 2018-09-19 MED ORDER — SUMATRIPTAN SUCCINATE 100 MG PO TABS: 100.0000 mg | ORAL_TABLET | ORAL | 5 refills | Status: DC | PRN

## 2018-09-19 MED ORDER — PROPRANOLOL HCL 10 MG PO TABS: 10.0000 mg | ORAL_TABLET | Freq: Two times a day (BID) | ORAL | 5 refills | Status: DC

## 2018-09-19 MED ORDER — PROPRANOLOL HCL 10 MG PO TABS: 10.0000 mg | ORAL_TABLET | Freq: Three times a day (TID) | ORAL | 5 refills | Status: DC

## 2018-09-19 NOTE — Patient Instructions (Signed)
Rice sock heating pad 

## 2018-09-19 NOTE — Assessment & Plan Note (Signed)
Obtain labs.

## 2018-09-19 NOTE — Assessment & Plan Note (Signed)
Migraine headaches versus other Obtain head CT scan without contrast Obtain lab work Neurology consultation with Dr. Brett Fairy Restart Imitrex as needed We will try propranolol 10 mg twice daily Backslash shoulder massage, rice heating pad

## 2018-09-19 NOTE — Assessment & Plan Note (Signed)
Very mild.  Propranolol for headache prophylaxis-low-dose

## 2018-09-19 NOTE — Assessment & Plan Note (Signed)
Vit D 

## 2018-09-19 NOTE — Assessment & Plan Note (Signed)
Persistent HAs Neurol ref Head CT

## 2018-09-19 NOTE — Progress Notes (Signed)
Subjective:  Patient ID: Leslie Beard, female    DOB: Mar 21, 1975  Age: 43 y.o. MRN: [id number]  CC: No chief complaint on file.   HPI Toshua P Winkel presents for daily HAs over 2 months duration.  The nature of the headache has changed.  It is located more on the left side and in the back.  It is constant during the day usually over.  5 out of 10 on the pain scale, dull.  There is no nausea vomiting.  No fever.  No nasal discharge.  No toothache.  Sometimes the headache would persist through the night.  Ibuprofen made her stomach upset  Outpatient Medications Prior to Visit  Medication Sig Dispense Refill  . cholecalciferol (VITAMIN D) 1000 UNITS tablet Take 2 tablets (2,000 Units total) by mouth daily. 100 tablet 3  . cyclobenzaprine (FLEXERIL) 5 MG tablet Take 1 tablet (5 mg total) by mouth 3 (three) times daily as needed for muscle spasms. 40 tablet 1  . ibuprofen (ADVIL) 600 MG tablet Take tid x 3 days, then tid prn pain 60 tablet 1  . meloxicam (MOBIC) 15 MG tablet Take 1 tablet (15 mg total) by mouth daily. 30 tablet 2  . SUMAtriptan (IMITREX) 100 MG tablet Take 1 tablet (100 mg total) by mouth every 2 (two) hours as needed for migraine. (Patient taking differently: Take 100 mg by mouth as needed for migraine. ) 10 tablet 5  . valACYclovir (VALTREX) 1000 MG tablet Take 1 tablet (1,000 mg total) by mouth 3 (three) times daily. 21 tablet 0  . vitamin B-12 (CYANOCOBALAMIN) 1000 MCG tablet Take 1,000 mcg by mouth daily.    . carbamazepine (CARBATROL) 100 MG 12 hr capsule Take 1 capsule (100 mg total) by mouth 2 (two) times daily. 60 capsule 5   No facility-administered medications prior to visit.     ROS: Review of Systems  Constitutional: Negative for activity change, appetite change, chills, fatigue, fever and unexpected weight change.  HENT: Negative for congestion, dental problem, ear pain, facial swelling, mouth sores, rhinorrhea, sinus pressure, sinus pain and sore throat.    Eyes: Negative for visual disturbance.  Respiratory: Negative for cough and chest tightness.   Gastrointestinal: Negative for abdominal pain and nausea.  Genitourinary: Negative for difficulty urinating, frequency and vaginal pain.  Musculoskeletal: Negative for back pain and gait problem.  Skin: Negative for pallor and rash.  Neurological: Positive for headaches. Negative for dizziness, tremors, weakness, light-headedness and numbness.  Psychiatric/Behavioral: Negative for confusion, decreased concentration, dysphoric mood, sleep disturbance and suicidal ideas. The patient is not nervous/anxious.     Objective:  BP 130/80 (BP Location: Left Arm, Patient Position: Sitting, Cuff Size: Normal)   Pulse 67   Temp 98.2 F (36.8 C) (Oral)   Ht 5\' 1"  (1.549 m)   Wt 144 lb (65.3 kg)   SpO2 99%   BMI 27.21 kg/m   BP Readings from Last 3 Encounters:  09/19/18 130/80  08/23/18 128/80  04/11/18 126/82    Wt Readings from Last 3 Encounters:  09/19/18 144 lb (65.3 kg)  08/23/18 144 lb 6.4 oz (65.5 kg)  04/11/18 142 lb (64.4 kg)    Physical Exam Constitutional:      General: She is not in acute distress.    Appearance: She is well-developed.  HENT:     Head: Normocephalic.     Right Ear: External ear normal.     Left Ear: External ear normal.     Nose: Nose  normal.  Eyes:     General:        Right eye: No discharge.        Left eye: No discharge.     Conjunctiva/sclera: Conjunctivae normal.     Pupils: Pupils are equal, round, and reactive to light.  Neck:     Musculoskeletal: Normal range of motion and neck supple.     Thyroid: No thyromegaly.     Vascular: No JVD.     Trachea: No tracheal deviation.  Cardiovascular:     Rate and Rhythm: Normal rate and regular rhythm.     Heart sounds: Normal heart sounds.  Pulmonary:     Effort: No respiratory distress.     Breath sounds: No stridor. No wheezing.  Abdominal:     General: Bowel sounds are normal. There is no  distension.     Palpations: Abdomen is soft. There is no mass.     Tenderness: There is no abdominal tenderness. There is no guarding or rebound.  Musculoskeletal:        General: No tenderness.  Lymphadenopathy:     Cervical: No cervical adenopathy.  Skin:    Findings: No erythema or rash.  Neurological:     Cranial Nerves: No cranial nerve deficit.     Motor: No abnormal muscle tone.     Coordination: Coordination normal.     Deep Tendon Reflexes: Reflexes normal.  Psychiatric:        Behavior: Behavior normal.        Thought Content: Thought content normal.        Judgment: Judgment normal.    Cervical and trap muscles are somewhat tense.  Neuro exam is nonfocal  Lab Results  Component Value Date   WBC 7.4 01/25/2018   HGB 14.4 01/25/2018   HCT 42.4 01/25/2018   PLT 232.0 01/25/2018   GLUCOSE 109 (H) 01/25/2018   CHOL 191 07/05/2016   TRIG 60.0 07/05/2016   HDL 56.40 07/05/2016   LDLCALC 123 (H) 07/05/2016   ALT 9 07/05/2016   AST 14 07/05/2016   NA 139 01/25/2018   K 3.7 01/25/2018   CL 101 01/25/2018   CREATININE 0.79 01/25/2018   BUN 10 01/25/2018   CO2 28 01/25/2018   TSH 1.24 01/25/2018    Dg Cervical Spine Complete  Result Date: 06/15/2017 CLINICAL DATA:  Neck pain and stiffness for 1 week. The patient reports a lump on the posterior aspect of the lower neck. EXAM: CERVICAL SPINE - COMPLETE 4+ VIEW COMPARISON:  None. FINDINGS: There is no evidence of cervical spine fracture or prevertebral soft tissue swelling. Alignment is normal. No other significant bone abnormalities are identified. Prominent fatty density the posterior aspect of the neck is identified. IMPRESSION: Normal cervical spine. Fatty prominence of soft tissues of the posterior neck may account for the patient's palpated abnormality. Electronically Signed   By: Inge Rise M.D.   On: 06/15/2017 15:38    Assessment & Plan:   There are no diagnoses linked to this encounter.   No orders of  the defined types were placed in this encounter.    Follow-up: No follow-ups on file.  Walker Kehr, MD

## 2018-09-20 LAB — VITAMIN D 25 HYDROXY (VIT D DEFICIENCY, FRACTURES): VITD: 20.38 ng/mL — ABNORMAL LOW (ref 30.00–100.00)

## 2018-09-21 ENCOUNTER — Other Ambulatory Visit: Payer: Self-pay | Admitting: Internal Medicine

## 2018-09-21 MED ORDER — VITAMIN D3 1.25 MG (50000 UT) PO CAPS: 1.0000 | ORAL_CAPSULE | ORAL | 0 refills | Status: DC

## 2018-09-28 ENCOUNTER — Other Ambulatory Visit: Payer: Self-pay

## 2018-09-28 ENCOUNTER — Ambulatory Visit
Admission: RE | Admit: 2018-09-28 | Discharge: 2018-09-28 | Disposition: A | Discharge status: Home / Self Care | Payer: Managed Care, Other (non HMO) | Source: Ambulatory Visit | Attending: Internal Medicine | Admitting: Internal Medicine

## 2018-09-28 DIAGNOSIS — R519 Headache, unspecified: Secondary | ICD-10-CM

## 2018-09-28 DIAGNOSIS — G4452 New daily persistent headache (NDPH): Secondary | ICD-10-CM

## 2018-11-15 ENCOUNTER — Other Ambulatory Visit: Payer: Self-pay

## 2018-11-15 ENCOUNTER — Encounter: Payer: Self-pay | Admitting: Neurology

## 2018-11-15 ENCOUNTER — Ambulatory Visit: Payer: Managed Care, Other (non HMO) | Admitting: Neurology

## 2018-11-15 VITALS — BP 139/86 | HR 61 | Ht 61.0 in | Wt 146.0 lb

## 2018-11-15 DIAGNOSIS — G478 Other sleep disorders: Secondary | ICD-10-CM

## 2018-11-15 DIAGNOSIS — R0683 Snoring: Secondary | ICD-10-CM

## 2018-11-15 DIAGNOSIS — G43109 Migraine with aura, not intractable, without status migrainosus: Secondary | ICD-10-CM

## 2018-11-15 DIAGNOSIS — G44019 Episodic cluster headache, not intractable: Secondary | ICD-10-CM | POA: Diagnosis not present

## 2018-11-15 DIAGNOSIS — G43E09 Chronic migraine with aura, not intractable, without status migrainosus: Secondary | ICD-10-CM

## 2018-11-15 MED ORDER — TOPIRAMATE 25 MG PO TABS: ORAL_TABLET | ORAL | 3 refills | Status: DC

## 2018-11-15 NOTE — Progress Notes (Signed)
SLEEP MEDICINE CLINIC    Provider:  Larey Seat, MD  Primary Care Physician:  Plotnikov, Evie Lacks, MD [place] [address]     Referring Provider: Cassandria Anger, Md [place],  [place] [address]          Chief Complaint according to patient   Patient presents with:    . New Patient (Initial Visit)           HISTORY OF PRESENT ILLNESS:  Leslie Beard is a 43 y.o. year old 30 or [place] American female patient seen  on 11/15/2018 Chief concern according to patient :  "headaches. 1.5 mth she was having headaches constantly for several weeks. they have gotten much better now. She struggles with monthly headaches that would happen days leading up to cycle. These headeache are bearable headaches but the headaches she was having daily for about a month and a half were different then the normal headache. They were sharp and would wake out of her sleep."   I have the pleasure of seeing Leslie Beard today, a right -handed Black or [place] American female with a possible sleep disorder.  She has a  has a past medical history of Migraine..  The patient has a longstanding history of headaches and still has sometimes headaches but about 2 months ago for 1 month duration she suffered headaches that were severe and she did not like them to a lightening bolt going off in her head.  Almost explosive headache that woke her out of sleep. It affected her ability to get comfortable at night, to sleep.  She pointed to her left temple area where she was most tender.  These explosive headaches could wake her more than once at night and they did that for several days in a row.  She felt that her life quality was severely affected.  Her ability to work and be productive was affected. Her migraines were frequenlty associated with menstrual cycle.     Sleep relevant medical history: Nocturnal headaches, taking screen breaks, not a stress headache, not tension headache  either.     Family medical /sleep history: daughter, age 32 , has migraines. She would vomit with headaches,  cyclic at night.   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 Family status is married , with 40 daughter.  The patient currently. Pets are not  present. Tobacco use never -   ETOH use : socially ,  Caffeine intake in form of Coffee( none  ) Soda( none ) Tea ( 1-2 ) or energy drinks. Regular exercise in form of walking..   =      Sleep habits are as follows: The patient's dinner time is between 6-7 PM. The patient goes to bed at 12 PM and continues to sleep for 2-3 hours, leaved the TV on (!),  Cell phone , she wakes for unknown reasons, rarely for urination.  The preferred sleep position is laterally, with the support of 1 pillow.  Dreams are reportedly  Frequent/vivid.Marland Kitchen  6.30 AM is the usual rise time. The patient wakes up spontaneously before her alarm 5.15 .   She reports not feeling refreshed or restored in AM, with symptoms such as morning headaches- these get better in the daytime.  Naps are taken rarely-  " I just can't get relaxed"   Review of Systems: Out of a complete  14 system review, the patient complains of only the following symptoms, and all other reviewed systems are negative.:  Fatigue, sleepiness , not snoring, insufficient sleep, cluster HA>    How likely are you to doze in the following situations: 0 = not likely, 1 = slight chance, 2 = moderate chance, 3 = high chance   Sitting and Reading? 3 Watching Television? 3 Sitting inactive in a public place (theater or meeting)? 1 As a passenger in a car for an hour without a break? 0 Lying down in the afternoon when circumstances permit?0 Sitting and talking to someone?0 Sitting quietly after lunch without alcohol?0 In a car, while stopped for a few minutes in traffic?0   Total = 7/ 24 points   FSS endorsed at 30/ 63 points.    Social History   Socioeconomic History  . Marital status: Married    Spouse name: Not on file  . Number of children: 3  . Years of education: Not on file  . Highest education level: Not on file  Occupational History  . Occupation: [employer]  Social Needs  . Financial resource strain: Not on file  . Food insecurity    Worry: Not on file    Inability: Not on file  . Transportation needs    Medical: Not on file    Non-medical: Not on file  Tobacco Use  . Smoking status: Never Smoker  . Smokeless tobacco: Never Used  Substance and Sexual Activity  . Alcohol use: Yes    Alcohol/week: 0.0 standard drinks    Comment: occasional wine-  . Drug use: No  . Sexual activity: Yes    Birth control/protection: Pill  Lifestyle  . Physical activity    Days per week: Not on file    Minutes per session: Not on file  . Stress: Not on file  Relationships  . Social Herbalist on phone: Not on file    Gets together: Not on file    Attends religious service: Not on file    Active member of club or organization: Not on file    Attends meetings of clubs or organizations: Not on file    Relationship status: Not on file  Other Topics Concern  . Not on file  Social History Narrative   Regular Exercise -  NO   Married 2 kids and a step-daughter          Family History  Problem Relation Age of Onset  . Asthma Daughter   . Hypertension Father   . Colon cancer Father 56  . Esophageal cancer Father        and gum cancer, smoker  . Lung cancer Paternal Uncle   . Bone cancer Paternal Grandfather   . Seizures Paternal Grandfather   . Hypertension Maternal Grandmother   . Thyroid disease Paternal Grandmother   . Asthma Son   . Seizures Paternal Uncle     Past Medical History:  Diagnosis Date  . Migraine     Past Surgical History:  Procedure Laterality Date  . CESAREAN SECTION       Current Outpatient Medications on File Prior to Visit  Medication Sig  Dispense Refill  . cholecalciferol (VITAMIN D) 1000 UNITS tablet Take 2 tablets (2,000 Units total) by mouth daily. 100 tablet 3  . Cholecalciferol (VITAMIN D3) 1.25 MG (50000 UT) CAPS Take 1 capsule by mouth once a week. 6 capsule 0  . SUMAtriptan (IMITREX) 100 MG  tablet Take 1 tablet (100 mg total) by mouth as needed for migraine or headache. 12 tablet 5  . vitamin B-12 (CYANOCOBALAMIN) 1000 MCG tablet Take 1,000 mcg by mouth daily.    . propranolol (INDERAL) 10 MG tablet Take 1 tablet (10 mg total) by mouth 2 (two) times daily. (Patient not taking: Reported on 11/15/2018) 60 tablet 5   No current facility-administered medications on file prior to visit.     Allergies  Allergen Reactions  . Sulfa Antibiotics Hives and Other (See Comments)    Throat tightened up    Physical exam:  Today's Vitals   11/15/18 1405  BP: 139/86  Pulse: 61  Weight: 146 lb (66.2 kg)  Height: 5\' 1"  (1.549 m)   Body mass index is 27.59 kg/m.   Wt Readings from Last 3 Encounters:  11/15/18 146 lb (66.2 kg)  09/19/18 144 lb (65.3 kg)  08/23/18 144 lb 6.4 oz (65.5 kg)     Ht Readings from Last 3 Encounters:  11/15/18 5\' 1"  (1.549 m)  09/19/18 5\' 1"  (1.549 m)  08/23/18 5\' 1"  (1.549 m)      General: The patient is awake, alert and appears not in acute distress. The patient is well groomed. Head: Normocephalic, atraumatic. Neck is supple. Mallampati 1,  neck circumference: 12.5  inches . Nasal airflow  patent.  Retrognathia is not seen.   Cardiovascular:  Regular rate and cardiac rhythm by pulse,  without distended neck veins. Respiratory: Lungs are clear to auscultation.  Skin:  Without evidence of ankle edema, or rash. Trunk: The patient's posture is erect.   Neurologic exam : The patient is awake and alert, oriented to place and time.   Memory subjective described as intact.  Attention span & concentration ability appears normal.  Speech is fluent,  without  dysarthria, dysphonia or aphasia.   Mood and affect are appropriate.   Cranial nerves: no loss of smell or taste reported  Pupils are equal and briskly reactive to light. Funduscopic exam deferred.   Extraocular movements in vertical and horizontal planes were intact and without nystagmus. No Diplopia. Visual fields by finger perimetry are intact. Hearing was intact to soft voice and finger rubbing.    Facial sensation intact to fine touch.  Facial motor strength is symmetric and tongue and uvula move midline.  Neck ROM : rotation, tilt and flexion extension were normal for age and shoulder shrug was symmetrical.    Motor exam:  Symmetric bulk, tone and ROM.   Normal tone without cog wheeling, symmetric grip strength .   Sensory:  Fine touch, pinprick and vibration were tested  and  normal.  Proprioception tested in the upper extremities was normal.   Coordination: Rapid alternating movements in the fingers/hands were of normal speed.  The Finger-to-nose maneuver was intact without evidence of ataxia, dysmetria or tremor.   Gait and station: Patient could rise unassisted from a seated position, walked without assistive device.  Stance is of normal width/ base and the patient turned with 3 steps.  Toe and heel walk were deferred.  Deep tendon reflexes: in the  upper and lower extremities are symmetric and intact.  Babinski response was deferred .      After spending a total time of  35  minutes face to face and additional time for physical and neurologic examination, review of laboratory studies,  personal review of imaging studies, reports and results of other testing and review of referral information / records as far as  provided in visit, I have established the following assessments:  1)  Chronic and Cluster headaches have resolved but a latent headache is present. Migraines are triggers by smells, light, by sound. Migraines are as frequent as 3 a month and lasting 2 days- 6 days a month.   2) restricted sleep time  and poor sleep hygiene- lets get rid off screen light, start bedtime before midnight.  COOL, QUIET AND DARK>   3) snoring sometimes.    My Plan is to proceed with:  1) Topiramate 25 mg.  Every night.  2)  HST after her sleep hygiene has been implemented.     I would like to thank Plotnikov, Evie Lacks, MD and Plotnikov, Evie Lacks, [place],  [place] [address] for allowing me to meet with and to take care of this pleasant patient.   In short, Leslie Beard is presenting with multiple headache types. I plan to follow up through our NP within 3 month.  CC: I will share my notes with PCP .  Electronically signed by: Larey Seat, MD 11/15/2018 2:24 PM  [facility] Board certified by The [employer] of Sleep Medicine and Diplomate of the [employer] of Sleep Medicine. Board certified In Neurology through the [employer], Fellow of the [employer] of Neurology. Medical Director of [facility].

## 2018-11-27 ENCOUNTER — Telehealth: Payer: Self-pay

## 2018-11-27 NOTE — Telephone Encounter (Signed)
We have attempted to call the patient three times to schedule sleep study.  Patient has been unavailable at the phone numbers we have on file.  Patient's voicemail box is full and I am unable to leave a message.  If patient calls back we will schedule them for their sleep study.

## 2019-06-05 IMAGING — DX DG CERVICAL SPINE COMPLETE 4+V
5 series · 5 of 5 positions shown · non-contrast
Comparison: None.

CLINICAL DATA: Neck pain and stiffness for 1 week. The patient
reports a lump on the posterior aspect of the lower neck.

EXAM:
CERVICAL SPINE - COMPLETE 4+ VIEW

[c-spine lat]
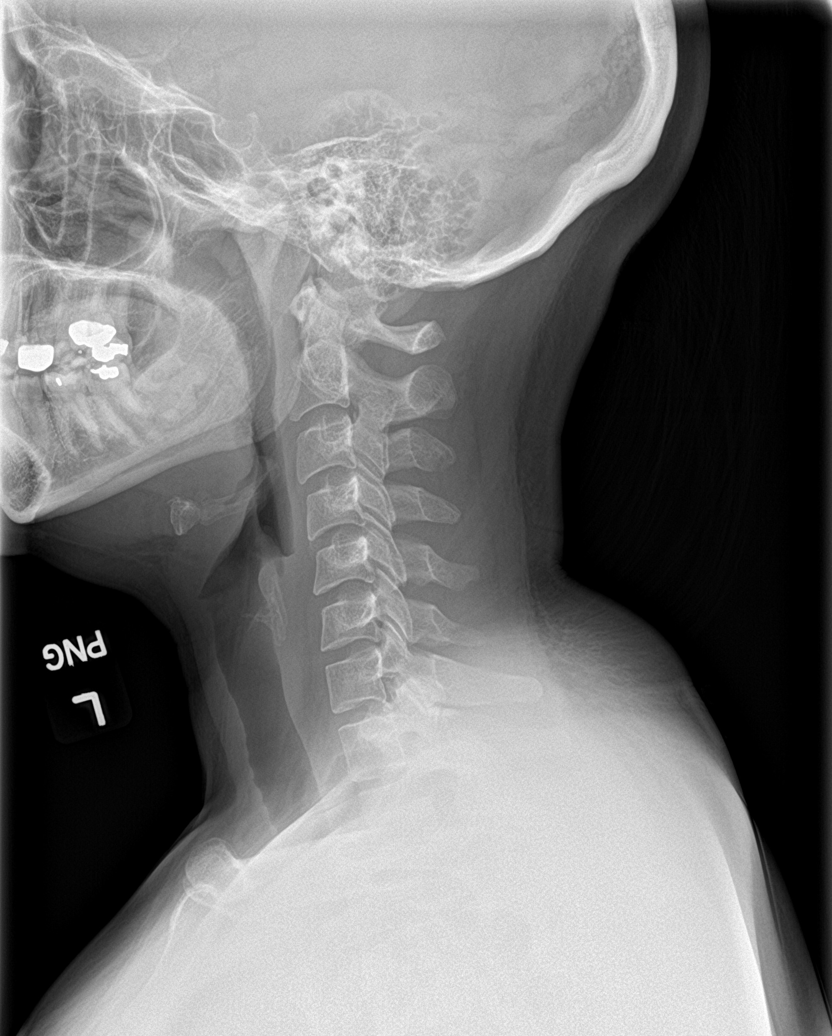

[c-spine obl (1 of 2)]
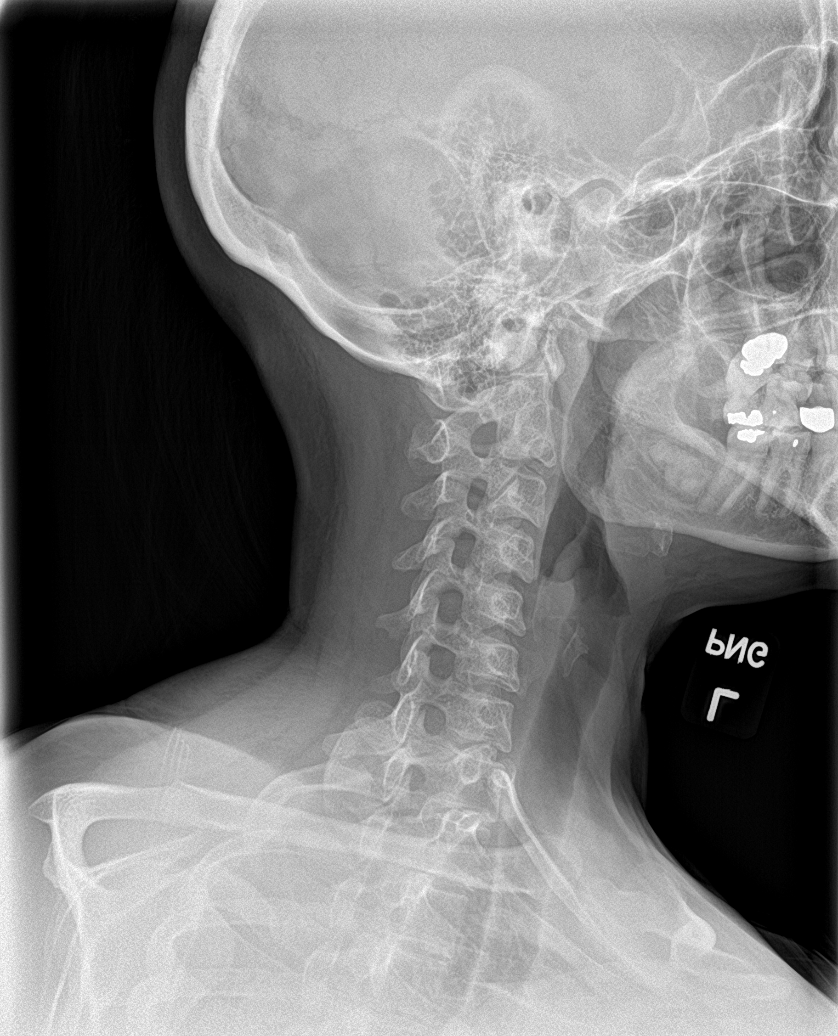

[c-spine obl (2 of 2)]
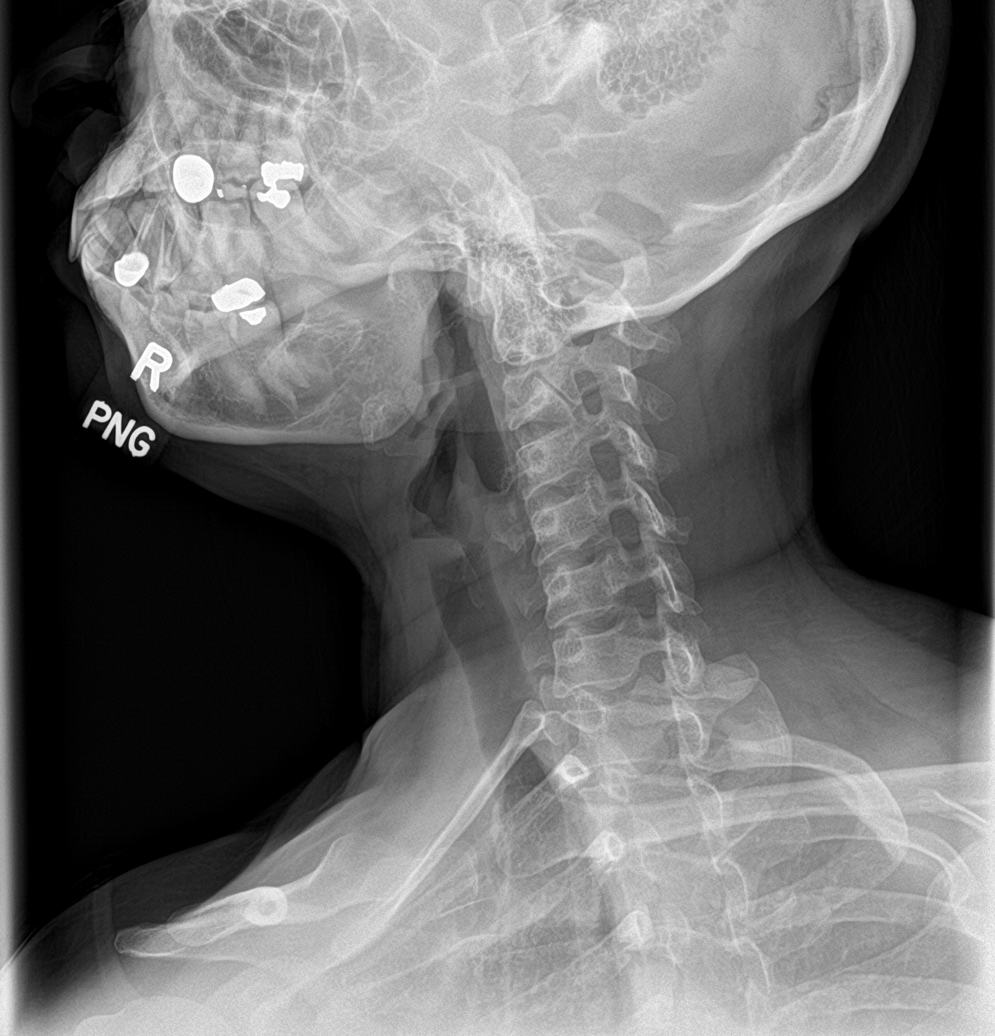

[c-spine ap]
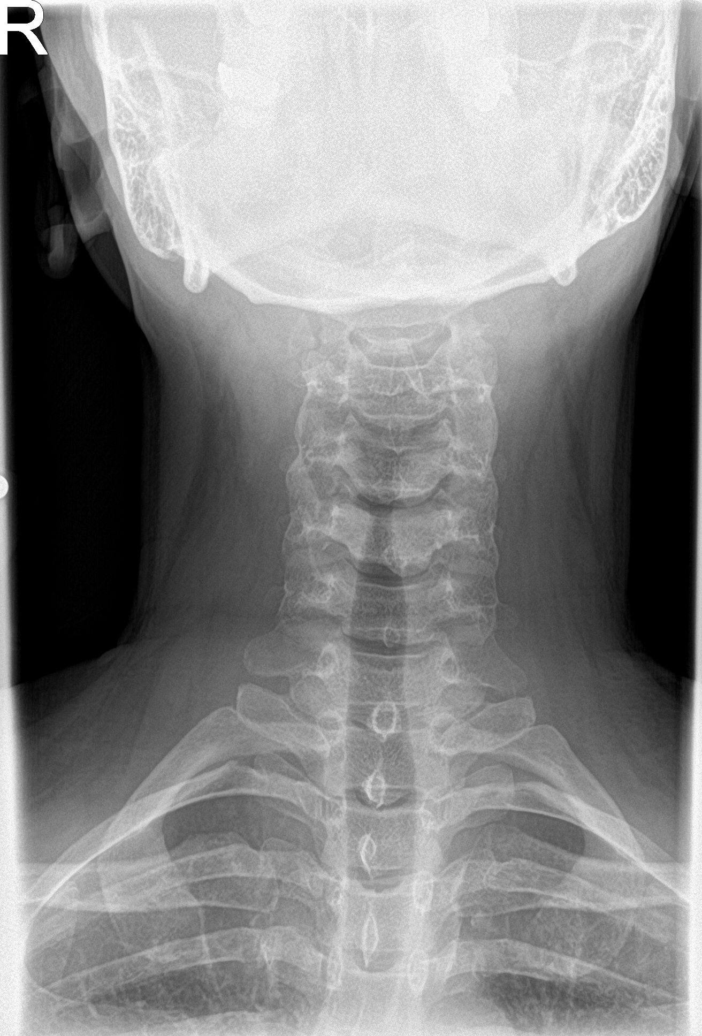

[c-spine open mouth]
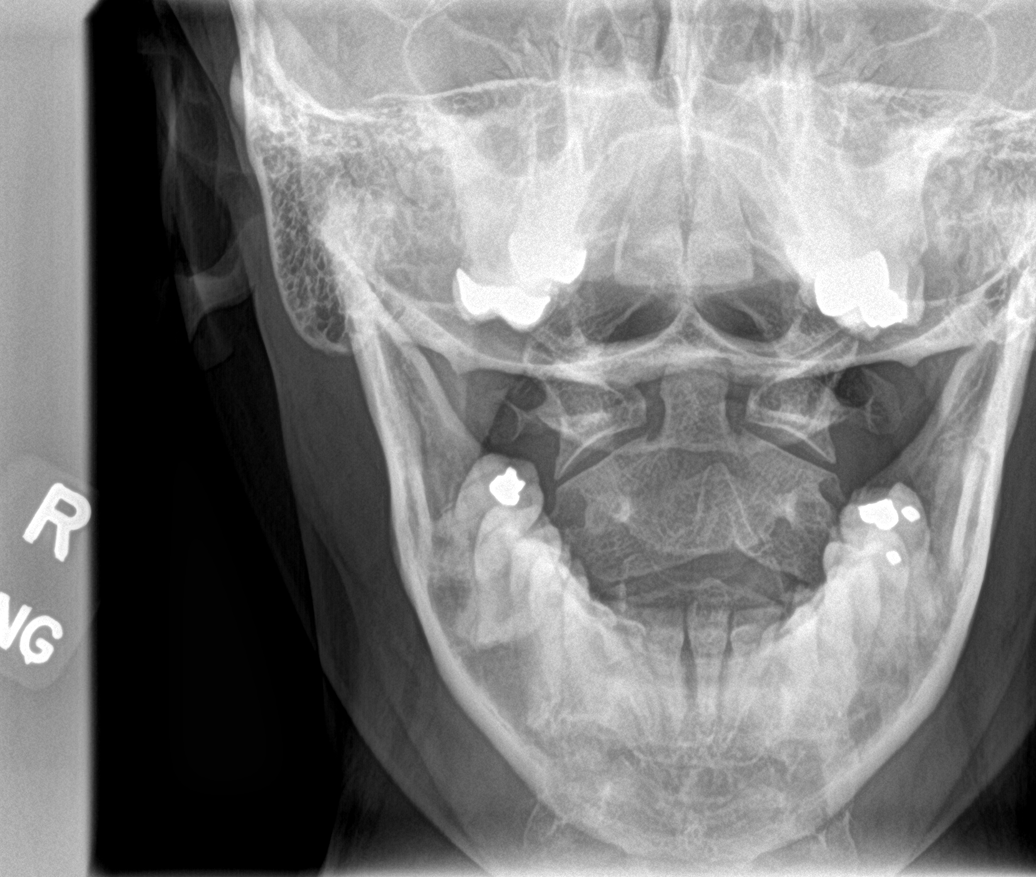

[5 of 5 positions shown; findings below may reference images not displayed]

FINDINGS: There is no evidence of cervical spine fracture or prevertebral soft
tissue swelling. Alignment is normal. No other significant bone
abnormalities are identified. Prominent fatty density the posterior
aspect of the neck is identified.
IMPRESSION: Normal cervical spine.

Fatty prominence of soft tissues of the posterior neck may account
for the patient's palpated abnormality.

## 2019-08-07 ENCOUNTER — Other Ambulatory Visit: Payer: Self-pay | Admitting: Obstetrics and Gynecology

## 2019-08-07 DIAGNOSIS — N63 Unspecified lump in unspecified breast: Secondary | ICD-10-CM

## 2019-08-23 ENCOUNTER — Other Ambulatory Visit: Payer: Self-pay

## 2019-08-23 ENCOUNTER — Ambulatory Visit
Admission: RE | Admit: 2019-08-23 | Discharge: 2019-08-23 | Disposition: A | Discharge status: Home / Self Care | Payer: Managed Care, Other (non HMO) | Source: Ambulatory Visit | Attending: Obstetrics and Gynecology | Admitting: Obstetrics and Gynecology

## 2019-08-23 DIAGNOSIS — N63 Unspecified lump in unspecified breast: Secondary | ICD-10-CM

## 2021-02-08 ENCOUNTER — Other Ambulatory Visit: Payer: Self-pay

## 2021-02-08 ENCOUNTER — Ambulatory Visit (INDEPENDENT_AMBULATORY_CARE_PROVIDER_SITE_OTHER): Payer: Managed Care, Other (non HMO) | Admitting: Internal Medicine

## 2021-02-08 ENCOUNTER — Encounter: Payer: Self-pay | Admitting: Internal Medicine

## 2021-02-08 VITALS — BP 122/84 | HR 67 | Temp 98.9°F | Ht 60.25 in | Wt 158.8 lb

## 2021-02-08 DIAGNOSIS — Z23 Encounter for immunization: Secondary | ICD-10-CM

## 2021-02-08 DIAGNOSIS — G43109 Migraine with aura, not intractable, without status migrainosus: Secondary | ICD-10-CM

## 2021-02-08 DIAGNOSIS — Z0000 Encounter for general adult medical examination without abnormal findings: Secondary | ICD-10-CM | POA: Diagnosis not present

## 2021-02-08 DIAGNOSIS — E538 Deficiency of other specified B group vitamins: Secondary | ICD-10-CM

## 2021-02-08 DIAGNOSIS — E559 Vitamin D deficiency, unspecified: Secondary | ICD-10-CM | POA: Diagnosis not present

## 2021-02-08 LAB — COMPREHENSIVE METABOLIC PANEL
ALT: 11 U/L (ref 0–35)
AST: 19 U/L (ref 0–37)
Albumin: 4.6 g/dL (ref 3.5–5.2)
Alkaline Phosphatase: 81 U/L (ref 39–117)
BUN: 15 mg/dL (ref 6–23)
CO2: 27 mEq/L (ref 19–32)
Calcium: 9.7 mg/dL (ref 8.4–10.5)
Chloride: 100 mEq/L (ref 96–112)
Creatinine, Ser: 0.84 mg/dL (ref 0.40–1.20)
GFR: 82.93 mL/min (ref >60.00)
Glucose, Bld: 84 mg/dL (ref 70–99)
Potassium: 3.6 mEq/L (ref 3.5–5.1)
Sodium: 138 mEq/L (ref 135–145)
Total Bilirubin: 0.5 mg/dL (ref 0.2–1.2)
Total Protein: 8 g/dL (ref 6.0–8.3)

## 2021-02-08 LAB — CBC WITH DIFFERENTIAL/PLATELET
Basophils Absolute: 0.1 10*3/uL (ref 0.0–0.1)
Basophils Relative: 1.1 % (ref 0.0–3.0)
Eosinophils Absolute: 0.2 10*3/uL (ref 0.0–0.7)
Eosinophils Relative: 2.2 % (ref 0.0–5.0)
HCT: 41.2 % (ref 36.0–46.0)
Hemoglobin: 13.8 g/dL (ref 12.0–15.0)
Lymphocytes Relative: 28.8 % (ref 12.0–46.0)
Lymphs Abs: 2.2 10*3/uL (ref 0.7–4.0)
MCHC: 33.5 g/dL (ref 30.0–36.0)
MCV: 91.9 fl (ref 78.0–100.0)
Monocytes Absolute: 0.6 10*3/uL (ref 0.1–1.0)
Monocytes Relative: 7.8 % (ref 3.0–12.0)
Neutro Abs: 4.7 10*3/uL (ref 1.4–7.7)
Neutrophils Relative %: 60.1 % (ref 43.0–77.0)
Platelets: 257 10*3/uL (ref 150.0–400.0)
RBC: 4.49 Mil/uL (ref 3.87–5.11)
RDW: 12.9 % (ref 11.5–15.5)
WBC: 7.8 10*3/uL (ref 4.0–10.5)

## 2021-02-08 LAB — URINALYSIS
Bilirubin Urine: NEGATIVE
Ketones, ur: NEGATIVE
Leukocytes,Ua: NEGATIVE
Nitrite: NEGATIVE
Specific Gravity, Urine: 1.01 (ref 1.000–1.030)
Total Protein, Urine: NEGATIVE
Urine Glucose: NEGATIVE
Urobilinogen, UA: 0.2 (ref 0.0–1.0)
pH: 6 (ref 5.0–8.0)

## 2021-02-08 LAB — LIPID PANEL
Cholesterol: 198 mg/dL (ref 0–200)
HDL: 65.5 mg/dL (ref >39.00)
LDL Cholesterol: 119 mg/dL — ABNORMAL HIGH (ref 0–99)
NonHDL: 132.3
Total CHOL/HDL Ratio: 3
Triglycerides: 68 mg/dL (ref 0.0–149.0)
VLDL: 13.6 mg/dL (ref 0.0–40.0)

## 2021-02-08 LAB — VITAMIN B12: Vitamin B-12: 259 pg/mL (ref 211–911)

## 2021-02-08 LAB — TSH: TSH: 2.83 u[IU]/mL (ref 0.35–5.50)

## 2021-02-08 LAB — VITAMIN D 25 HYDROXY (VIT D DEFICIENCY, FRACTURES): VITD: 22.67 ng/mL — ABNORMAL LOW (ref 30.00–100.00)

## 2021-02-08 NOTE — Progress Notes (Signed)
Subjective:  Patient ID: Leslie Beard, female    DOB: 04/18/1975  Age: 46 y.o. MRN: [id number]  CC: Annual Exam   HPI Leslie Beard presents for a well exam   Outpatient Medications Prior to Visit  Medication Sig Dispense Refill   Multiple Vitamins-Minerals (ADULT GUMMY PO) Take by mouth daily.     cholecalciferol (VITAMIN D) 1000 UNITS tablet Take 2 tablets (2,000 Units total) by mouth daily. 100 tablet 3   Cholecalciferol (VITAMIN D3) 1.25 MG (50000 UT) CAPS Take 1 capsule by mouth once a week. (Patient not taking: Reported on 02/08/2021) 6 capsule 0   propranolol (INDERAL) 10 MG tablet Take 1 tablet (10 mg total) by mouth 2 (two) times daily. (Patient not taking: Reported on 11/15/2018) 60 tablet 5   SUMAtriptan (IMITREX) 100 MG tablet Take 1 tablet (100 mg total) by mouth as needed for migraine or headache. (Patient not taking: Reported on 02/08/2021) 12 tablet 5   topiramate (TOPAMAX) 25 MG tablet Take one at night- every night - for 4 weeks, if tolerated increase to BID. (Patient not taking: Reported on 02/08/2021) 120 tablet 3   vitamin B-12 (CYANOCOBALAMIN) 1000 MCG tablet Take 1,000 mcg by mouth daily. (Patient not taking: Reported on 02/08/2021)     No facility-administered medications prior to visit.    ROS: Review of Systems  Constitutional:  Negative for activity change, appetite change, chills, fatigue and unexpected weight change.  HENT:  Negative for congestion, mouth sores and sinus pressure.   Eyes:  Negative for visual disturbance.  Respiratory:  Negative for cough and chest tightness.   Gastrointestinal:  Negative for abdominal pain and nausea.  Genitourinary:  Negative for difficulty urinating, frequency and vaginal pain.  Musculoskeletal:  Negative for back pain and gait problem.  Skin:  Negative for pallor and rash.  Neurological:  Negative for dizziness, tremors, weakness, numbness and headaches.  Psychiatric/Behavioral:  Negative for confusion and sleep  disturbance.    Objective:  BP 122/84 (BP Location: Left Arm)    Pulse 67    Temp 98.9 F (37.2 C) (Oral)    Ht 5' 0.25" (1.53 m)    Wt 158 lb 12.8 oz (72 kg)    SpO2 96%    BMI 30.76 kg/m   BP Readings from Last 3 Encounters:  02/08/21 122/84  11/15/18 139/86  09/19/18 130/80    Wt Readings from Last 3 Encounters:  02/08/21 158 lb 12.8 oz (72 kg)  11/15/18 146 lb (66.2 kg)  09/19/18 144 lb (65.3 kg)    Physical Exam Constitutional:      General: She is not in acute distress.    Appearance: She is well-developed.  HENT:     Head: Normocephalic.     Right Ear: External ear normal.     Left Ear: External ear normal.     Nose: Nose normal.  Eyes:     General:        Right eye: No discharge.        Left eye: No discharge.     Conjunctiva/sclera: Conjunctivae normal.     Pupils: Pupils are equal, round, and reactive to light.  Neck:     Thyroid: No thyromegaly.     Vascular: No JVD.     Trachea: No tracheal deviation.  Cardiovascular:     Rate and Rhythm: Normal rate and regular rhythm.     Heart sounds: Normal heart sounds.  Pulmonary:     Effort: No respiratory distress.  Breath sounds: No stridor. No wheezing.  Abdominal:     General: Bowel sounds are normal. There is no distension.     Palpations: Abdomen is soft. There is no mass.     Tenderness: There is no abdominal tenderness. There is no guarding or rebound.  Musculoskeletal:        General: No tenderness.     Cervical back: Normal range of motion and neck supple. No rigidity.  Lymphadenopathy:     Cervical: No cervical adenopathy.  Skin:    Findings: No erythema or rash.  Neurological:     Cranial Nerves: No cranial nerve deficit.     Motor: No abnormal muscle tone.     Coordination: Coordination normal.     Deep Tendon Reflexes: Reflexes normal.  Psychiatric:        Behavior: Behavior normal.        Thought Content: Thought content normal.        Judgment: Judgment normal.    Lab Results   Component Value Date   WBC 6.7 09/19/2018   HGB 13.5 09/19/2018   HCT 39.8 09/19/2018   PLT 222.0 09/19/2018   GLUCOSE 84 09/19/2018   CHOL 191 07/05/2016   TRIG 60.0 07/05/2016   HDL 56.40 07/05/2016   LDLCALC 123 (H) 07/05/2016   ALT 9 07/05/2016   AST 14 07/05/2016   NA 138 09/19/2018   K 3.8 09/19/2018   CL 102 09/19/2018   CREATININE 0.76 09/19/2018   BUN 13 09/19/2018   CO2 28 09/19/2018   TSH 1.79 09/19/2018    US BREAST LTD UNI LEFT INC AXILLA  Result Date: 08/23/2019 CLINICAL DATA:  46 year old female with new linear indentation along the OUTER LEFT breast identified on self-examination. EXAM: DIGITAL DIAGNOSTIC LEFT MAMMOGRAM WITH CAD AND TOMO ULTRASOUND LEFT BREAST COMPARISON:  Previous exam(s). ACR Breast Density Category b: There are scattered areas of fibroglandular density. FINDINGS: 2D/3D full field and spot compression views of the LEFT breast demonstrate no suspicious mass, distortion or worrisome calcifications. Mammographic images were processed with CAD. On physical exam, minimal linear indentation is noted along the entire OUTER LEFT breast when the patient raises her arm. Targeted ultrasound is performed, showing no sonographic abnormalities within the entire OUTER LEFT breast. No solid or cystic mass, distortion or abnormal shadowing identified. IMPRESSION: 1. No mammographic or sonographic abnormality within the OUTER LEFT breast, in the area of patient concern. 2. No mammographic evidence of LEFT breast malignancy. RECOMMENDATION: Consider clinical follow-up as indicated. Any further workup should be based on clinical grounds. Bilateral screening mammogram in January 2022 to resume annual mammogram schedule. I have discussed the findings and recommendations with the patient. If applicable, a reminder letter will be sent to the patient regarding the next appointment. BI-RADS CATEGORY  1: Negative. Electronically Signed   By: Margarette Canada M.D.   On: 08/23/2019 15:41    MM DIAG BREAST TOMO UNI LEFT  Result Date: 08/23/2019 CLINICAL DATA:  46 year old female with new linear indentation along the OUTER LEFT breast identified on self-examination. EXAM: DIGITAL DIAGNOSTIC LEFT MAMMOGRAM WITH CAD AND TOMO ULTRASOUND LEFT BREAST COMPARISON:  Previous exam(s). ACR Breast Density Category b: There are scattered areas of fibroglandular density. FINDINGS: 2D/3D full field and spot compression views of the LEFT breast demonstrate no suspicious mass, distortion or worrisome calcifications. Mammographic images were processed with CAD. On physical exam, minimal linear indentation is noted along the entire OUTER LEFT breast when the patient raises her arm. Targeted ultrasound is  performed, showing no sonographic abnormalities within the entire OUTER LEFT breast. No solid or cystic mass, distortion or abnormal shadowing identified. IMPRESSION: 1. No mammographic or sonographic abnormality within the OUTER LEFT breast, in the area of patient concern. 2. No mammographic evidence of LEFT breast malignancy. RECOMMENDATION: Consider clinical follow-up as indicated. Any further workup should be based on clinical grounds. Bilateral screening mammogram in January 2022 to resume annual mammogram schedule. I have discussed the findings and recommendations with the patient. If applicable, a reminder letter will be sent to the patient regarding the next appointment. BI-RADS CATEGORY  1: Negative. Electronically Signed   By: Margarette Canada M.D.   On: 08/23/2019 15:41    Assessment & Plan:   Problem List Items Addressed This Visit     B12 deficiency    On Rx      Migraine headache    Better off BCP      Vitamin D deficiency    On Vit D      Well adult exam    We discussed age appropriate health related issues, including available/recomended screening tests and vaccinations. We discussed a need for adhering to healthy diet and exercise. Labs/EKG were reviewed/ordered. All questions were  answered.          No orders of the defined types were placed in this encounter.     Follow-up: Return in about 1 year (around 02/08/2022) for Wellness Exam.  Walker Kehr, MD

## 2021-02-08 NOTE — Assessment & Plan Note (Signed)
On Vit D 

## 2021-02-08 NOTE — Assessment & Plan Note (Signed)
Better off BCP

## 2021-02-08 NOTE — Assessment & Plan Note (Signed)
We discussed age appropriate health related issues, including available/recomended screening tests and vaccinations. We discussed a need for adhering to healthy diet and exercise. Labs/EKG were reviewed/ordered. All questions were answered.   

## 2021-02-08 NOTE — Addendum Note (Signed)
Addended by: Earnstine Regal on: 02/08/2021 01:47 PM   Modules accepted: Orders

## 2021-02-08 NOTE — Assessment & Plan Note (Signed)
On Rx 

## 2021-02-09 ENCOUNTER — Other Ambulatory Visit: Payer: Self-pay | Admitting: Internal Medicine

## 2021-02-09 DIAGNOSIS — E559 Vitamin D deficiency, unspecified: Secondary | ICD-10-CM

## 2021-02-09 DIAGNOSIS — E538 Deficiency of other specified B group vitamins: Secondary | ICD-10-CM

## 2021-02-09 MED ORDER — VITAMIN D (ERGOCALCIFEROL) 1.25 MG (50000 UNIT) PO CAPS: 50000.0000 [IU] | ORAL_CAPSULE | ORAL | 0 refills | Status: DC

## 2021-02-09 MED ORDER — VITAMIN D3 50 MCG (2000 UT) PO CAPS: 2000.0000 [IU] | ORAL_CAPSULE | Freq: Every day | ORAL | 3 refills | Status: DC

## 2021-02-09 NOTE — Assessment & Plan Note (Signed)
low Vitamin D levels -  start Vit D prescription 50000 iu weekly followed by over-the-counter Vit D 2000 iu daily.

## 2021-02-09 NOTE — Assessment & Plan Note (Signed)
Find vitamin Gummies with higher content of vitamin B12.

## 2021-04-06 ENCOUNTER — Ambulatory Visit: Payer: Managed Care, Other (non HMO) | Admitting: Emergency Medicine

## 2021-04-06 ENCOUNTER — Encounter: Payer: Self-pay | Admitting: Emergency Medicine

## 2021-04-06 VITALS — BP 108/82 | HR 71 | Temp 98.4°F | Ht 60.0 in | Wt 161.5 lb

## 2021-04-06 DIAGNOSIS — R519 Headache, unspecified: Secondary | ICD-10-CM

## 2021-04-06 DIAGNOSIS — J0100 Acute maxillary sinusitis, unspecified: Secondary | ICD-10-CM

## 2021-04-06 DIAGNOSIS — R0981 Nasal congestion: Secondary | ICD-10-CM | POA: Diagnosis not present

## 2021-04-06 MED ORDER — AMOXICILLIN-POT CLAVULANATE 875-125 MG PO TABS: 1.0000 | ORAL_TABLET | Freq: Two times a day (BID) | ORAL | 0 refills | Status: AC

## 2021-04-06 MED ORDER — TRIAMCINOLONE ACETONIDE 55 MCG/ACT NA AERO: 2.0000 | INHALATION_SPRAY | Freq: Every day | NASAL | 12 refills | Status: DC

## 2021-04-06 NOTE — Patient Instructions (Signed)

## 2021-04-06 NOTE — Progress Notes (Signed)
Leslie Beard ?46 y.o. ? ? ?Chief Complaint  ?Patient presents with  ? Nasal Congestion  ?  X 3 days.  Tried OTC mucinex, Claritin   ? ? ?HISTORY OF PRESENT ILLNESS: ?Acute problem visit today. ?This is a 46 y.o. female complaining of sinus drainage, sinus headache and cough that started about 10 days ago.  Progressively getting worse.  Tried Claritin and Mucinex over-the-counter.  Denies fever, chest pain, difficulty breathing, nausea or vomiting, or any other associated symptoms. ?No other complaints or medical concerns today. ? ?HPI ? ? ?Prior to Admission medications   ?Medication Sig Start Date End Date Taking? Authorizing Provider  ?Multiple Vitamins-Minerals (ADULT GUMMY PO) Take by mouth daily.   Yes [provider]  ?Cholecalciferol (VITAMIN D3) 50 MCG (2000 UT) capsule Take 1 capsule (2,000 Units total) by mouth daily. ?Patient not taking: Reported on 04/06/2021 02/09/21   Plotnikov, Evie Lacks, MD  ?Vitamin D, Ergocalciferol, (DRISDOL) 1.25 MG (50000 UNIT) CAPS capsule Take 1 capsule (50,000 Units total) by mouth every 7 (seven) days. ?Patient not taking: Reported on 04/06/2021 02/09/21   Plotnikov, Evie Lacks, MD  ? ? ?Allergies  ?Allergen Reactions  ? Sulfa Antibiotics Hives and Other (See Comments)  ?  Throat tightened up  ? ? ?Patient Active Problem List  ? Diagnosis Date Noted  ? Trigeminal neuralgia of right side of face 08/13/2018  ? Eustachian tube disorder, right 04/11/2018  ? CTS (carpal tunnel syndrome) 01/25/2018  ? Posterior neck pain 06/15/2017  ? Family history of colon cancer 07/05/2016  ? Elevated blood pressure reading 04/16/2015  ? Vitamin D deficiency 10/21/2014  ? Postherpetic neuralgia 10/17/2014  ? Lipoma of abdominal wall 09/09/2014  ? Shingles 09/09/2014  ? Other malaise and fatigue 07/29/2013  ? B12 deficiency 07/29/2013  ? Well adult exam 02/21/2012  ? Dyspnea on exertion 02/08/2012  ? Headache disorder 01/10/2011  ? Migraine headache 01/10/2011  ? UTI (urinary tract  infection) 05/12/2010  ? Left flank pain 05/12/2010  ? Hematuria, microscopic 05/12/2010  ? Acute upper respiratory infection 05/15/2008  ? Acute bronchitis 12/03/2007  ? ? ?Past Medical History:  ?Diagnosis Date  ? Migraine   ? ? ?Past Surgical History:  ?Procedure Laterality Date  ? CESAREAN SECTION    ? ? ?Social History  ? ?Socioeconomic History  ? Marital status: Married  ?  Spouse name: Not on file  ? Number of children: 3  ? Years of education: Not on file  ? Highest education level: Not on file  ?Occupational History  ? Occupation: [employer] [place]  ?Tobacco Use  ? Smoking status: Never  ? Smokeless tobacco: Never  ?Vaping Use  ? Vaping Use: Never used  ?Substance and Sexual Activity  ? Alcohol use: Yes  ?  Alcohol/week: 0.0 standard drinks  ?  Comment: occasional wine-  ? Drug use: No  ? Sexual activity: Yes  ?  Birth control/protection: Pill  ?Other Topics Concern  ? Not on file  ?Social History Narrative  ? Regular Exercise -  NO  ? Married 2 kids and a step-daughter  ?   ?   ? ?Social Determinants of Health  ? ?Financial Resource Strain: Not on file  ?Food Insecurity: Not on file  ?Transportation Needs: Not on file  ?Physical Activity: Not on file  ?Stress: Not on file  ?Social Connections: Not on file  ?Intimate Partner Violence: Not on file  ? ? ?Family History  ?Problem Relation Age of  Onset  ? Asthma Daughter   ? Hypertension Father   ? Colon cancer Father 89  ? Esophageal cancer Father   ?     and gum cancer, smoker  ? Lung cancer Paternal Uncle   ? Bone cancer Paternal Grandfather   ? Seizures Paternal Grandfather   ? Hypertension Maternal Grandmother   ? Thyroid disease Paternal Grandmother   ? Asthma Son   ? Seizures Paternal Uncle   ? ? ? ?Review of Systems  ?Constitutional: Negative.  Negative for chills and fever.  ?HENT:  Positive for congestion and sinus pain.   ?Respiratory:  Positive for cough. Negative for shortness of breath.   ?Cardiovascular: Negative.  Negative for chest  pain and palpitations.  ?Gastrointestinal:  Negative for abdominal pain, diarrhea, nausea and vomiting.  ?Genitourinary: Negative.   ?Skin: Negative.   ?Neurological:  Positive for headaches. Negative for dizziness.  ? ?Today's Vitals  ? 04/06/21 0944  ?BP: 108/82  ?Pulse: 71  ?Temp: 98.4 ?F (36.9 ?C)  ?SpO2: 97%  ?Weight: 161 lb 8 oz (73.3 kg)  ?Height: 5' (1.524 m)  ? ?Body mass index is 31.54 kg/m?. ? ?Physical Exam ?Vitals reviewed.  ?Constitutional:   ?   Appearance: Normal appearance.  ?HENT:  ?   Head: Normocephalic.  ?   Right Ear: Tympanic membrane, ear canal and external ear normal.  ?   Left Ear: Tympanic membrane, ear canal and external ear normal.  ?   Nose:  ?   Right Sinus: Maxillary sinus tenderness present.  ?   Left Sinus: Maxillary sinus tenderness present.  ?   Mouth/Throat:  ?   Mouth: Mucous membranes are moist.  ?   Pharynx: Oropharynx is clear.  ?Eyes:  ?   Extraocular Movements: Extraocular movements intact.  ?   Conjunctiva/sclera: Conjunctivae normal.  ?   Pupils: Pupils are equal, round, and reactive to light.  ?Cardiovascular:  ?   Rate and Rhythm: Normal rate and regular rhythm.  ?   Pulses: Normal pulses.  ?   Heart sounds: Normal heart sounds.  ?Pulmonary:  ?   Effort: Pulmonary effort is normal.  ?   Breath sounds: Normal breath sounds.  ?Musculoskeletal:  ?   Cervical back: No tenderness.  ?   Right lower leg: No edema.  ?   Left lower leg: No edema.  ?Lymphadenopathy:  ?   Cervical: No cervical adenopathy.  ?Skin: ?   General: Skin is warm and dry.  ?   Capillary Refill: Capillary refill takes less than 2 seconds.  ?Neurological:  ?   General: No focal deficit present.  ?   Mental Status: She is alert and oriented to person, place, and time.  ?Psychiatric:     ?   Mood and Affect: Mood normal.     ?   Behavior: Behavior normal.  ? ? ? ?ASSESSMENT & PLAN: ?Clinically stable.  No red flag signs or symptoms. ?Viral infection now developing secondary bacterial infection. ?May benefit  from Augmentin twice a day for 7 days. ?Advised to continue Mucinex and use nasal saline spray as frequently as needed. ?May benefit from Nasacort nasal spray. ?Tylenol and or Advil for sinus headaches. ?Follow-up with PCP if no better or worse during the next couple weeks. ? ?Problem List Items Addressed This Visit   ?None ?Visit Diagnoses   ? ? Acute non-recurrent maxillary sinusitis    -  Primary  ? Relevant Medications  ? triamcinolone (NASACORT) 55 MCG/ACT AERO  nasal inhaler  ? amoxicillin-clavulanate (AUGMENTIN) 875-125 MG tablet  ? Sinus congestion      ? Relevant Medications  ? triamcinolone (NASACORT) 55 MCG/ACT AERO nasal inhaler  ? Sinus headache      ? ?  ? ?Patient Instructions  ?Sinusitis, Adult ?Sinusitis is soreness and swelling (inflammation) of your sinuses. Sinuses are hollow spaces in the bones around your face. They are located: ?Around your eyes. ?In the middle of your forehead. ?Behind your nose. ?In your cheekbones. ?Your sinuses and nasal passages are lined with a fluid called mucus. Mucus drains out of your sinuses. Swelling can trap mucus in your sinuses. This lets germs (bacteria, virus, or fungus) grow, which leads to infection. Most of the time, this condition is caused by a virus. ?What are the causes? ?This condition is caused by: ?Allergies. ?Asthma. ?Germs. ?Things that block your nose or sinuses. ?Growths in the nose (nasal polyps). ?Chemicals or irritants in the air. ?Fungus (rare). ?What increases the risk? ?You are more likely to develop this condition if: ?You have a weak body defense system (immune system). ?You do a lot of swimming or diving. ?You use nasal sprays too much. ?You smoke. ?What are the signs or symptoms? ?The main symptoms of this condition are pain and a feeling of pressure around the sinuses. Other symptoms include: ?Stuffy nose (congestion). ?Runny nose (drainage). ?Swelling and warmth in the sinuses. ?Headache. ?Toothache. ?A cough that may get worse at  night. ?Mucus that collects in the throat or the back of the nose (postnasal drip). ?Being unable to smell and taste. ?Being very tired (fatigue). ?A fever. ?Sore throat. ?Bad breath. ?How is this diagnosed? ?This c

## 2021-06-11 ENCOUNTER — Encounter (HOSPITAL_COMMUNITY): Payer: Self-pay

## 2021-06-11 ENCOUNTER — Emergency Department (HOSPITAL_COMMUNITY)
Admission: EM | Admit: 2021-06-11 | Discharge: 2021-06-11 | Disposition: A | Discharge status: Home / Self Care | Payer: Managed Care, Other (non HMO) | Attending: Emergency Medicine | Admitting: Emergency Medicine

## 2021-06-11 ENCOUNTER — Emergency Department (HOSPITAL_COMMUNITY): Payer: Managed Care, Other (non HMO)

## 2021-06-11 ENCOUNTER — Other Ambulatory Visit: Payer: Self-pay

## 2021-06-11 DIAGNOSIS — E876 Hypokalemia: Secondary | ICD-10-CM | POA: Diagnosis not present

## 2021-06-11 DIAGNOSIS — R519 Headache, unspecified: Secondary | ICD-10-CM | POA: Diagnosis not present

## 2021-06-11 DIAGNOSIS — R0789 Other chest pain: Secondary | ICD-10-CM | POA: Diagnosis not present

## 2021-06-11 DIAGNOSIS — R202 Paresthesia of skin: Secondary | ICD-10-CM | POA: Insufficient documentation

## 2021-06-11 DIAGNOSIS — R200 Anesthesia of skin: Secondary | ICD-10-CM

## 2021-06-11 DIAGNOSIS — R079 Chest pain, unspecified: Secondary | ICD-10-CM

## 2021-06-11 LAB — BASIC METABOLIC PANEL
Anion gap: 9 (ref 5–15)
BUN: 12 mg/dL (ref 6–20)
CO2: 27 mmol/L (ref 22–32)
Calcium: 10.3 mg/dL (ref 8.9–10.3)
Chloride: 104 mmol/L (ref 98–111)
Creatinine, Ser: 0.83 mg/dL (ref 0.44–1.00)
GFR, Estimated: >60 mL/min (ref >60)
Glucose, Bld: 121 mg/dL — ABNORMAL HIGH (ref 70–99)
Potassium: 3.3 mmol/L — ABNORMAL LOW (ref 3.5–5.1)
Sodium: 140 mmol/L (ref 135–145)

## 2021-06-11 LAB — CBC
HCT: 41 % (ref 36.0–46.0)
Hemoglobin: 13.8 g/dL (ref 12.0–15.0)
MCH: 30.9 pg (ref 26.0–34.0)
MCHC: 33.7 g/dL (ref 30.0–36.0)
MCV: 91.9 fL (ref 80.0–100.0)
Platelets: 251 10*3/uL (ref 150–400)
RBC: 4.46 MIL/uL (ref 3.87–5.11)
RDW: 12.4 % (ref 11.5–15.5)
WBC: 7.6 10*3/uL (ref 4.0–10.5)
nRBC: 0 % (ref 0.0–0.2)

## 2021-06-11 LAB — TROPONIN I (HIGH SENSITIVITY)
Troponin I (High Sensitivity): <2 ng/L (ref <18)
Troponin I (High Sensitivity): 3 ng/L (ref <18)

## 2021-06-11 NOTE — ED Triage Notes (Signed)
Patient reports that she woke with a headache around 0500 today. Patient c/o light sensitivity. Patient denies any n/v. Patient then states she developed mid chest pain shortly after waking and then she developed right arm numbness Patient c/o SOB that started approx one hour ago.

## 2021-06-11 NOTE — ED Provider Notes (Signed)
[facility] DEPT Provider Note   CSN: [id number] Arrival date & time: 06/11/21  1347     History  Chief Complaint  Patient presents with   Chest Pain   Numbness   Headache    Leslie Beard is a 46 y.o. female.  46 year old female presents with 1 day history of intermittent right arm paresthesias.  Patient states they wax and wane.  States that she also woke up this morning with headache.  States that she gets headaches quite often.  Patient notes that she did a lot of stress recently.  This is corroborated by her husband.  Denies any SI or HI.  Patient states that she had chest discomfort which she describes as uneasiness which last for seconds to minutes.  Has never had any prior history of this.  Denies any cardiac risk factors.  Denies any focal weakness.  No treatment use prior to arrival      Home Medications Prior to Admission medications   Medication Sig Start Date End Date Taking? Authorizing Provider  Cholecalciferol (VITAMIN D3) 50 MCG (2000 UT) capsule Take 1 capsule (2,000 Units total) by mouth daily. Patient not taking: Reported on 04/06/2021 02/09/21   Plotnikov, Evie Lacks, MD  Multiple Vitamins-Minerals (ADULT GUMMY PO) Take by mouth daily.    [provider]  triamcinolone (NASACORT) 55 MCG/ACT AERO nasal inhaler Place 2 sprays into the nose daily. 04/06/21   Horald Pollen, MD  Vitamin D, Ergocalciferol, (DRISDOL) 1.25 MG (50000 UNIT) CAPS capsule Take 1 capsule (50,000 Units total) by mouth every 7 (seven) days. Patient not taking: Reported on 04/06/2021 02/09/21   Plotnikov, Evie Lacks, MD      Allergies    Sulfa antibiotics    Review of Systems   Review of Systems  All other systems reviewed and are negative.  Physical Exam Updated Vital Signs BP (!) 147/93   Pulse 60   Temp 98.2 F (36.8 C) (Oral)   Resp (!) 24   Ht 1.53 m (5' 0.25")   Wt 72.6 kg   SpO2 100%   BMI 30.99 kg/m  Physical Exam Vitals and  nursing note reviewed.  Constitutional:      General: She is not in acute distress.    Appearance: Normal appearance. She is well-developed. She is not toxic-appearing.  HENT:     Head: Normocephalic and atraumatic.  Eyes:     General: Lids are normal.     Conjunctiva/sclera: Conjunctivae normal.     Pupils: Pupils are equal, round, and reactive to light.  Neck:     Thyroid: No thyroid mass.     Trachea: No tracheal deviation.  Cardiovascular:     Rate and Rhythm: Normal rate and regular rhythm.     Heart sounds: Normal heart sounds. No murmur heard.   No gallop.  Pulmonary:     Effort: Pulmonary effort is normal. No respiratory distress.     Breath sounds: Normal breath sounds. No stridor. No decreased breath sounds, wheezing, rhonchi or rales.  Abdominal:     General: There is no distension.     Palpations: Abdomen is soft.     Tenderness: There is no abdominal tenderness. There is no rebound.  Musculoskeletal:        General: No tenderness. Normal range of motion.     Cervical back: Normal range of motion and neck supple.  Skin:    General: Skin is warm and dry.     Findings: No  abrasion or rash.  Neurological:     Mental Status: She is alert and oriented to person, place, and time. Mental status is at baseline.     GCS: GCS eye subscore is 4. GCS verbal subscore is 5. GCS motor subscore is 6.     Cranial Nerves: No cranial nerve deficit.     Sensory: No sensory deficit.     Motor: Motor function is intact.  Psychiatric:        Attention and Perception: Attention normal.        Speech: Speech normal.        Behavior: Behavior normal.    ED Results / Procedures / Treatments   Labs (all labs ordered are listed, but only abnormal results are displayed) Labs Reviewed  BASIC METABOLIC PANEL - Abnormal; Notable for the following components:      Result Value   Potassium 3.3 (*)    Glucose, Bld 121 (*)    All other components within normal limits  CBC  TROPONIN I (HIGH  SENSITIVITY)  TROPONIN I (HIGH SENSITIVITY)    EKG EKG Interpretation  Date/Time:  Friday June 11 2021 15:25:12 EDT Ventricular Rate:  66 PR Interval:  146 QRS Duration: 90 QT Interval:  350 QTC Calculation: 367 R Axis:   50 Text Interpretation: Sinus rhythm Confirmed by Lacretia Leigh ([id number]) on 06/11/2021 3:53:12 PM  Radiology DG Chest 2 View  Result Date: 06/11/2021 CLINICAL DATA:  Chest pain and headache.  Light sensitivity. EXAM: CHEST - 2 VIEW COMPARISON:  02/07/2012. FINDINGS: Trachea is midline. Heart size normal. Lungs are clear. No pleural fluid. IMPRESSION: No acute findings. Electronically Signed   By: Lorin Picket M.D.   On: 06/11/2021 15:11    Procedures Procedures    Medications Ordered in ED Medications - No data to display  ED Course/ Medical Decision Making/ A&P                           Medical Decision Making Amount and/or Complexity of Data Reviewed Labs: ordered. Radiology: ordered. ECG/medicine tests: ordered.   Patient is EKG per my interpretation shows no signs of acute findings.  Patient is head CT per my interpretation shows no acute findings.  Electrolytes significant for mild hypokalemia and patient encouraged to eat bananas.  Considered etiologies such as cardiac versus possible stroke and feel less likely given her work-up as well as her presentation.  Patient's husband and daughter note that patient has been a great deal of stress recently.  Feel that this is a component to it.  She has no acute psychiatric emergency at this time.  Will discharge home and she will see her doctor as needed        Final Clinical Impression(s) / ED Diagnoses Final diagnoses:  None    Rx / DC Orders ED Discharge Orders     None         Lacretia Leigh, MD 06/11/21 1720

## 2021-12-06 ENCOUNTER — Encounter: Payer: Self-pay | Admitting: Family

## 2021-12-06 ENCOUNTER — Ambulatory Visit: Payer: Managed Care, Other (non HMO) | Admitting: Family

## 2021-12-06 VITALS — BP 146/98 | HR 83 | Temp 97.8°F | Ht 60.0 in | Wt 166.1 lb

## 2021-12-06 DIAGNOSIS — R053 Chronic cough: Secondary | ICD-10-CM | POA: Diagnosis not present

## 2021-12-06 MED ORDER — AZITHROMYCIN 250 MG PO TABS: ORAL_TABLET | ORAL | 0 refills | Status: AC

## 2021-12-06 NOTE — Progress Notes (Signed)
Patient ID: Leslie Beard, female    DOB: 08-29-1975, 46 y.o.   MRN: [id number]  Chief Complaint  Patient presents with   Cough    Pt c/o Cough with yellow/green for over 2 weeks. Has tried mucinex which did help. Pt states she is having some drainage from the nose also.     HPI:     Persistent cough:  Pt c/o Cough with yellow/green for over 2 weeks, with pleuritic CP. Has tried mucinex which did help. Pt states she is having some drainage from the nose also. Denies fever or  sore throat. Reports some increasing SOB, no wheezing.      Assessment & Plan:  1. Persistent cough sending Zpack, advised on use & SE. Drink plenty of fluids, restart generic Nascort to help with sinus sx, use 1 squirt each side bid for 5 d, then reduce to qd if still needed.   - azithromycin (ZITHROMAX) 250 MG tablet; Take 2 tablets on day 1, then 1 tablet daily on days 2 through 5  Dispense: 6 tablet; Refill: 0   Subjective:    Outpatient Medications Prior to Visit  Medication Sig Dispense Refill   Cholecalciferol (VITAMIN D3) 50 MCG (2000 UT) capsule Take 1 capsule (2,000 Units total) by mouth daily. 100 capsule 3   Multiple Vitamins-Minerals (ADULT GUMMY PO) Take by mouth daily.     triamcinolone (NASACORT) 55 MCG/ACT AERO nasal inhaler Place 2 sprays into the nose daily. 1 each 12   Vitamin D, Ergocalciferol, (DRISDOL) 1.25 MG (50000 UNIT) CAPS capsule Take 1 capsule (50,000 Units total) by mouth every 7 (seven) days. 6 capsule 0   No facility-administered medications prior to visit.   Past Medical History:  Diagnosis Date   Migraine    Past Surgical History:  Procedure Laterality Date   CESAREAN SECTION     Allergies  Allergen Reactions   Sulfa Antibiotics Hives and Other (See Comments)    Throat tightened up      Objective:    Physical Exam Vitals and nursing note reviewed.  Constitutional:      Appearance: Normal appearance. She is not ill-appearing.     Interventions: Face mask in  place.  HENT:     Right Ear: Tympanic membrane and ear canal normal.     Left Ear: Tympanic membrane and ear canal normal.     Nose:     Right Sinus: Frontal sinus tenderness present.     Left Sinus: Frontal sinus tenderness present.     Mouth/Throat:     Mouth: Mucous membranes are moist.     Pharynx: Posterior oropharyngeal erythema (mild) present. No pharyngeal swelling, oropharyngeal exudate or uvula swelling.     Tonsils: No tonsillar exudate or tonsillar abscesses.  Cardiovascular:     Rate and Rhythm: Normal rate and regular rhythm.  Pulmonary:     Effort: Pulmonary effort is normal.     Breath sounds: Normal breath sounds.  Chest:     Chest wall: Tenderness present.  Musculoskeletal:        General: Normal range of motion.  Lymphadenopathy:     Head:     Right side of head: No preauricular or posterior auricular adenopathy.     Left side of head: No preauricular or posterior auricular adenopathy.     Cervical: No cervical adenopathy.  Skin:    General: Skin is warm and dry.  Neurological:     Mental Status: She is alert.  Psychiatric:  Mood and Affect: Mood normal.        Behavior: Behavior normal.    BP (!) 146/98 (BP Location: Left Arm, Patient Position: Sitting, Cuff Size: Large)   Pulse 83   Temp 97.8 F (36.6 C) (Temporal)   Ht 5' (1.524 m)   Wt 166 lb 2 oz (75.4 kg)   SpO2 97%   BMI 32.44 kg/m  Wt Readings from Last 3 Encounters:  12/06/21 166 lb 2 oz (75.4 kg)  06/11/21 160 lb (72.6 kg)  04/06/21 161 lb 8 oz (73.3 kg)       Jeanie Sewer, NP

## 2021-12-07 ENCOUNTER — Ambulatory Visit: Payer: Managed Care, Other (non HMO) | Admitting: Family Medicine

## 2021-12-17 ENCOUNTER — Ambulatory Visit (INDEPENDENT_AMBULATORY_CARE_PROVIDER_SITE_OTHER): Payer: Managed Care, Other (non HMO)

## 2021-12-17 ENCOUNTER — Ambulatory Visit: Payer: Managed Care, Other (non HMO) | Admitting: Family Medicine

## 2021-12-17 ENCOUNTER — Encounter: Payer: Self-pay | Admitting: Family Medicine

## 2021-12-17 VITALS — BP 122/84 | HR 85 | Temp 97.5°F | Ht 60.0 in | Wt 165.0 lb

## 2021-12-17 DIAGNOSIS — R058 Other specified cough: Secondary | ICD-10-CM

## 2021-12-17 DIAGNOSIS — R0789 Other chest pain: Secondary | ICD-10-CM | POA: Diagnosis not present

## 2021-12-17 MED ORDER — AMOXICILLIN-POT CLAVULANATE 875-125 MG PO TABS: 1.0000 | ORAL_TABLET | Freq: Two times a day (BID) | ORAL | 0 refills | Status: DC

## 2021-12-17 NOTE — Progress Notes (Signed)
Subjective:  Leslie Beard is a 46 y.o. female who presents for a persistent cough.  Coughing a lot during the day. Clear and yellowish mucus.  Feeling short of breath when she talks a lot. No wheezing but has chest tightness at times. Right chest wall pain.   She took a Z-pak and completed in last week. She did not feel any better.   Denies fever, chills, fatigue, headaches, dizziness, palpitations, abdominal pain, N/V/D.   She was taking Mucinex.    No other c/o.  ROS as in subjective.   Objective: Vitals:   12/17/21 0800  BP: 122/84  Pulse: 85  Temp: (!) 97.5 F (36.4 C)  SpO2: 99%    General appearance: Alert, WD/WN, no distress, mildly ill appearing                             Skin: warm, no rash                           Head: no sinus tenderness                            Eyes: conjunctiva normal, corneas clear, PERRLA                            Ears: pearly TMs, external ear canals normal                          Nose: septum midline, turbinates swollen, with erythema              Mouth/throat: MMM, tongue normal, mild pharyngeal erythema                           Neck: supple, no adenopathy, no thyromegaly, nontender                          Heart: RRR                         Lungs: CTA bilaterally, no wheezes, rales, or rhonchi      Assessment: Cough present for greater than 3 weeks - Plan: DG Chest 2 View, amoxicillin-clavulanate (AUGMENTIN) 875-125 MG tablet  Chest tightness - Plan: DG Chest 2 View   Plan: Chest XR ordered.  Augmentin prescribed. Recommend Mucinex bid.  Discussed oral steroids but she prefers to hold off for now.  Nasal saline spray for congestion.  Tylenol or Ibuprofen OTC prn.  Call/return in 2-3 days if symptoms aren't resolving or if not back to baseline when she completes the antibiotic.

## 2021-12-17 NOTE — Patient Instructions (Signed)
Please go downstairs for chest x-ray before you leave today.  Take the antibiotic as prescribed.  Take the antibiotic with food.  Make sure you are eating and yogurt daily with good bacteria or take a probiotic.  Try Mucinex 1200 mg twice daily for the next 2 to 3 days.  Drink plenty of water.  Follow-up if you are not improving in the next 3 to 4 days or if you are not back to baseline when you complete the antibiotic.

## 2021-12-23 ENCOUNTER — Encounter: Payer: Self-pay | Admitting: *Deleted

## 2022-01-10 HISTORY — PX: OTHER SURGICAL HISTORY: SHX169

## 2022-02-10 ENCOUNTER — Encounter: Payer: Managed Care, Other (non HMO) | Admitting: Internal Medicine

## 2022-02-11 ENCOUNTER — Encounter: Payer: Self-pay | Admitting: Internal Medicine

## 2022-02-11 ENCOUNTER — Ambulatory Visit: Payer: Managed Care, Other (non HMO) | Admitting: Internal Medicine

## 2022-02-11 VITALS — BP 126/80 | HR 59 | Temp 98.3°F | Ht 60.0 in | Wt 166.0 lb

## 2022-02-11 DIAGNOSIS — Z86010 Personal history of colonic polyps: Secondary | ICD-10-CM

## 2022-02-11 DIAGNOSIS — Z0000 Encounter for general adult medical examination without abnormal findings: Secondary | ICD-10-CM

## 2022-02-11 DIAGNOSIS — G43109 Migraine with aura, not intractable, without status migrainosus: Secondary | ICD-10-CM

## 2022-02-11 DIAGNOSIS — R61 Generalized hyperhidrosis: Secondary | ICD-10-CM | POA: Diagnosis not present

## 2022-02-11 DIAGNOSIS — E538 Deficiency of other specified B group vitamins: Secondary | ICD-10-CM

## 2022-02-11 LAB — COMPREHENSIVE METABOLIC PANEL
ALT: 9 U/L (ref 0–35)
AST: 17 U/L (ref 0–37)
Albumin: 4.4 g/dL (ref 3.5–5.2)
Alkaline Phosphatase: 93 U/L (ref 39–117)
BUN: 13 mg/dL (ref 6–23)
CO2: 29 mEq/L (ref 19–32)
Calcium: 9.7 mg/dL (ref 8.4–10.5)
Chloride: 102 mEq/L (ref 96–112)
Creatinine, Ser: 0.84 mg/dL (ref 0.40–1.20)
GFR: 82.35 mL/min (ref >60.00)
Glucose, Bld: 93 mg/dL (ref 70–99)
Potassium: 4.1 mEq/L (ref 3.5–5.1)
Sodium: 138 mEq/L (ref 135–145)
Total Bilirubin: 0.6 mg/dL (ref 0.2–1.2)
Total Protein: 7.8 g/dL (ref 6.0–8.3)

## 2022-02-11 LAB — FOLLICLE STIMULATING HORMONE: FSH: 67.4 m[IU]/mL

## 2022-02-11 LAB — LIPID PANEL
Cholesterol: 185 mg/dL (ref 0–200)
HDL: 61.8 mg/dL (ref >39.00)
LDL Cholesterol: 111 mg/dL — ABNORMAL HIGH (ref 0–99)
NonHDL: 123.2
Total CHOL/HDL Ratio: 3
Triglycerides: 62 mg/dL (ref 0.0–149.0)
VLDL: 12.4 mg/dL (ref 0.0–40.0)

## 2022-02-11 LAB — URINALYSIS
Bilirubin Urine: NEGATIVE
Ketones, ur: NEGATIVE
Leukocytes,Ua: NEGATIVE
Nitrite: NEGATIVE
Specific Gravity, Urine: 1.015 (ref 1.000–1.030)
Total Protein, Urine: NEGATIVE
Urine Glucose: NEGATIVE
Urobilinogen, UA: 0.2 (ref 0.0–1.0)
pH: 7.5 (ref 5.0–8.0)

## 2022-02-11 LAB — CBC WITH DIFFERENTIAL/PLATELET
Basophils Absolute: 0.1 10*3/uL (ref 0.0–0.1)
Basophils Relative: 1.2 % (ref 0.0–3.0)
Eosinophils Absolute: 0.2 10*3/uL (ref 0.0–0.7)
Eosinophils Relative: 2.4 % (ref 0.0–5.0)
HCT: 39.6 % (ref 36.0–46.0)
Hemoglobin: 13.8 g/dL (ref 12.0–15.0)
Lymphocytes Relative: 21.2 % (ref 12.0–46.0)
Lymphs Abs: 1.4 10*3/uL (ref 0.7–4.0)
MCHC: 34.9 g/dL (ref 30.0–36.0)
MCV: 90.9 fl (ref 78.0–100.0)
Monocytes Absolute: 0.5 10*3/uL (ref 0.1–1.0)
Monocytes Relative: 7.5 % (ref 3.0–12.0)
Neutro Abs: 4.5 10*3/uL (ref 1.4–7.7)
Neutrophils Relative %: 67.7 % (ref 43.0–77.0)
Platelets: 238 10*3/uL (ref 150.0–400.0)
RBC: 4.36 Mil/uL (ref 3.87–5.11)
RDW: 12.8 % (ref 11.5–15.5)
WBC: 6.6 10*3/uL (ref 4.0–10.5)

## 2022-02-11 LAB — VITAMIN D 25 HYDROXY (VIT D DEFICIENCY, FRACTURES): VITD: 29.62 ng/mL — ABNORMAL LOW (ref 30.00–100.00)

## 2022-02-11 LAB — VITAMIN B12: Vitamin B-12: 571 pg/mL (ref 211–911)

## 2022-02-11 LAB — TSH: TSH: 1.59 u[IU]/mL (ref 0.35–5.50)

## 2022-02-11 MED ORDER — RIZATRIPTAN BENZOATE 10 MG PO TABS: 10.0000 mg | ORAL_TABLET | Freq: Once | ORAL | 12 refills | Status: DC | PRN

## 2022-02-11 MED ORDER — IBUPROFEN 600 MG PO TABS: ORAL_TABLET | ORAL | 3 refills | Status: DC

## 2022-02-11 NOTE — Assessment & Plan Note (Signed)
Maxalt prn

## 2022-02-11 NOTE — Assessment & Plan Note (Signed)
On B12 

## 2022-02-11 NOTE — Progress Notes (Signed)
Subjective:  Patient ID: Leslie Beard, female    DOB: 1975/02/11  Age: 47 y.o. MRN: [id number]  CC: Annual Exam   HPI Leslie Beard presents for a well exam C/o HAs, sweats   Outpatient Medications Prior to Visit  Medication Sig Dispense Refill   Cholecalciferol (VITAMIN D3) 50 MCG (2000 UT) capsule Take 1 capsule (2,000 Units total) by mouth daily. 100 capsule 3   Multiple Vitamins-Minerals (ADULT GUMMY PO) Take by mouth daily.     triamcinolone (NASACORT) 55 MCG/ACT AERO nasal inhaler Place 2 sprays into the nose daily. 1 each 12   amoxicillin-clavulanate (AUGMENTIN) 875-125 MG tablet Take 1 tablet by mouth 2 (two) times daily. 20 tablet 0   Vitamin D, Ergocalciferol, (DRISDOL) 1.25 MG (50000 UNIT) CAPS capsule Take 1 capsule (50,000 Units total) by mouth every 7 (seven) days. (Patient not taking: Reported on 02/11/2022) 6 capsule 0   No facility-administered medications prior to visit.    ROS: Review of Systems  Constitutional:  Negative for activity change, appetite change, chills, fatigue and unexpected weight change.  HENT:  Negative for congestion, mouth sores and sinus pressure.   Eyes:  Negative for visual disturbance.  Respiratory:  Negative for cough and chest tightness.   Gastrointestinal:  Negative for abdominal pain and nausea.  Genitourinary:  Negative for difficulty urinating, frequency and vaginal pain.  Musculoskeletal:  Negative for back pain and gait problem.  Skin:  Negative for pallor and rash.  Neurological:  Negative for dizziness, tremors, weakness, numbness and headaches.  Psychiatric/Behavioral:  Negative for confusion and sleep disturbance.     Objective:  BP 126/80 (BP Location: Left Arm)   Pulse (!) 59   Temp 98.3 F (36.8 C) (Oral)   Ht 5' (1.524 m)   Wt 166 lb (75.3 kg)   SpO2 98%   BMI 32.42 kg/m   BP Readings from Last 3 Encounters:  02/11/22 126/80  12/17/21 122/84  12/06/21 (!) 146/98    Wt Readings from Last 3 Encounters:   02/11/22 166 lb (75.3 kg)  12/17/21 165 lb (74.8 kg)  12/06/21 166 lb 2 oz (75.4 kg)    Physical Exam Constitutional:      General: She is not in acute distress.    Appearance: She is well-developed. She is obese.  HENT:     Head: Normocephalic.     Right Ear: External ear normal.     Left Ear: External ear normal.     Nose: Nose normal.  Eyes:     General:        Right eye: No discharge.        Left eye: No discharge.     Conjunctiva/sclera: Conjunctivae normal.     Pupils: Pupils are equal, round, and reactive to light.  Neck:     Thyroid: No thyromegaly.     Vascular: No JVD.     Trachea: No tracheal deviation.  Cardiovascular:     Rate and Rhythm: Normal rate and regular rhythm.     Heart sounds: Normal heart sounds.  Pulmonary:     Effort: No respiratory distress.     Breath sounds: No stridor. No wheezing.  Abdominal:     General: Bowel sounds are normal. There is no distension.     Palpations: Abdomen is soft. There is no mass.     Tenderness: There is no abdominal tenderness. There is no guarding or rebound.  Musculoskeletal:        General: No tenderness.  Cervical back: Normal range of motion and neck supple. No rigidity.  Lymphadenopathy:     Cervical: No cervical adenopathy.  Skin:    Findings: No erythema or rash.  Neurological:     Cranial Nerves: No cranial nerve deficit.     Motor: No abnormal muscle tone.     Coordination: Coordination normal.     Deep Tendon Reflexes: Reflexes normal.  Psychiatric:        Behavior: Behavior normal.        Thought Content: Thought content normal.        Judgment: Judgment normal.     Lab Results  Component Value Date   WBC 6.6 02/11/2022   HGB 13.8 02/11/2022   HCT 39.6 02/11/2022   PLT 238.0 02/11/2022   GLUCOSE 93 02/11/2022   CHOL 185 02/11/2022   TRIG 62.0 02/11/2022   HDL 61.80 02/11/2022   LDLCALC 111 (H) 02/11/2022   ALT 9 02/11/2022   AST 17 02/11/2022   NA 138 02/11/2022   K 4.1  02/11/2022   CL 102 02/11/2022   CREATININE 0.84 02/11/2022   BUN 13 02/11/2022   CO2 29 02/11/2022   TSH 1.59 02/11/2022    CT Head Wo Contrast  Result Date: 06/11/2021 CLINICAL DATA:  Dizziness, persistent/recurrent, cardiac or vascular cause suspected. Headache and light sensitivity since 0500 today. No reported injury. EXAM: CT HEAD WITHOUT CONTRAST TECHNIQUE: Contiguous axial images were obtained from the base of the skull through the vertex without intravenous contrast. RADIATION DOSE REDUCTION: This exam was performed according to the departmental dose-optimization program which includes automated exposure control, adjustment of the mA and/or kV according to patient size and/or use of iterative reconstruction technique. COMPARISON:  09/28/2018 head CT. FINDINGS: Brain: No evidence of parenchymal hemorrhage or extra-axial fluid collection. No mass lesion, mass effect, or midline shift. No CT evidence of acute infarction. Cerebral volume is age appropriate. No ventriculomegaly. Vascular: No acute abnormality. Skull: No evidence of calvarial fracture. Sinuses/Orbits: The visualized paranasal sinuses are essentially clear. Other:  The mastoid air cells are unopacified. IMPRESSION: Negative head CT. No evidence of acute intracranial abnormality. Electronically Signed   By: Ilona Sorrel M.D.   On: 06/11/2021 16:19   DG Chest 2 View  Result Date: 06/11/2021 CLINICAL DATA:  Chest pain and headache.  Light sensitivity. EXAM: CHEST - 2 VIEW COMPARISON:  02/07/2012. FINDINGS: Trachea is midline. Heart size normal. Lungs are clear. No pleural fluid. IMPRESSION: No acute findings. Electronically Signed   By: Lorin Picket M.D.   On: 06/11/2021 15:11    Assessment & Plan:   Problem List Items Addressed This Visit       Cardiovascular and Mediastinum   Migraine headache    Maxalt prn      Relevant Medications   ibuprofen (ADVIL) 600 MG tablet   rizatriptan (MAXALT) 10 MG tablet     Other    Well adult exam - Primary    We discussed age appropriate health related issues, including available/recomended screening tests and vaccinations. We discussed a need for adhering to healthy diet and exercise. Labs/EKG were reviewed/ordered. All questions were answered.      Relevant Orders   TSH (Completed)   Urinalysis (Completed)   CBC with Differential/Platelet (Completed)   Lipid panel (Completed)   Comprehensive metabolic panel (Completed)   Night sweats    Check FSH Discussed HRT, [place]       Relevant Orders   FSH (Completed)   B12 deficiency  On B12      Relevant Orders   Vitamin B12 (Completed)   VITAMIN D 25 Hydroxy (Vit-D Deficiency, Fractures) (Completed)   Other Visit Diagnoses     History of colon polyps       Relevant Orders   Ambulatory referral to Gastroenterology         Meds ordered this encounter  Medications   ibuprofen (ADVIL) 600 MG tablet    Sig: Take twice a day prn headaches    Dispense:  60 tablet    Refill:  3   rizatriptan (MAXALT) 10 MG tablet    Sig: Take 1 tablet (10 mg total) by mouth once as needed for up to 1 dose for migraine. May repeat in 2 hours if needed    Dispense:  12 tablet    Refill:  12      Follow-up: Return in about 1 year (around 02/12/2023) for Wellness Exam.  Walker Kehr, MD

## 2022-02-11 NOTE — Patient Instructions (Signed)
[place]

## 2022-02-11 NOTE — Assessment & Plan Note (Signed)
Check FSH Discussed HRT, [place]

## 2022-02-11 NOTE — Assessment & Plan Note (Signed)
We discussed age appropriate health related issues, including available/recomended screening tests and vaccinations. We discussed a need for adhering to healthy diet and exercise. Labs/EKG were reviewed/ordered. All questions were answered.   

## 2022-03-02 ENCOUNTER — Ambulatory Visit
Admission: EM | Admit: 2022-03-02 | Discharge: 2022-03-02 | Disposition: A | Discharge status: Home / Self Care | Payer: Managed Care, Other (non HMO) | Attending: Urgent Care | Admitting: Urgent Care

## 2022-03-02 ENCOUNTER — Ambulatory Visit (INDEPENDENT_AMBULATORY_CARE_PROVIDER_SITE_OTHER): Payer: Managed Care, Other (non HMO)

## 2022-03-02 DIAGNOSIS — R202 Paresthesia of skin: Secondary | ICD-10-CM

## 2022-03-02 DIAGNOSIS — M542 Cervicalgia: Secondary | ICD-10-CM | POA: Diagnosis not present

## 2022-03-02 DIAGNOSIS — M79602 Pain in left arm: Secondary | ICD-10-CM

## 2022-03-02 DIAGNOSIS — R079 Chest pain, unspecified: Secondary | ICD-10-CM

## 2022-03-02 DIAGNOSIS — R200 Anesthesia of skin: Secondary | ICD-10-CM | POA: Diagnosis not present

## 2022-03-02 MED ORDER — TIZANIDINE HCL 4 MG PO TABS: 4.0000 mg | ORAL_TABLET | Freq: Every day | ORAL | 0 refills | Status: DC

## 2022-03-02 MED ORDER — NAPROXEN 375 MG PO TABS: 375.0000 mg | ORAL_TABLET | Freq: Two times a day (BID) | ORAL | 0 refills | Status: DC

## 2022-03-02 NOTE — ED Provider Notes (Signed)
[facility]  Note:  This document was prepared using Systems analyst and may include unintentional dictation errors.  MRN: [id number] DOB: Apr 16, 1975  Subjective:   Leslie Beard is a 47 y.o. female presenting for 2 week history of persistent intermittent left sided chest pain, last episode was today at work. Symptoms started while sitting at work, typing, lasted 10 minutes and resolved on its own. Symptoms can be moderate to severe. No medications taken for the chest pain. No history of heart disease, diabetes, HTN. No smoking of any kind.   No current facility-administered medications for this encounter.  Current Outpatient Medications:    Cholecalciferol (VITAMIN D3) 50 MCG (2000 UT) capsule, Take 1 capsule (2,000 Units total) by mouth daily., Disp: 100 capsule, Rfl: 3   ibuprofen (ADVIL) 600 MG tablet, Take twice a day prn headaches, Disp: 60 tablet, Rfl: 3   Multiple Vitamins-Minerals (ADULT GUMMY PO), Take by mouth daily., Disp: , Rfl:    rizatriptan (MAXALT) 10 MG tablet, Take 1 tablet (10 mg total) by mouth once as needed for up to 1 dose for migraine. May repeat in 2 hours if needed, Disp: 12 tablet, Rfl: 12   triamcinolone (NASACORT) 55 MCG/ACT AERO nasal inhaler, Place 2 sprays into the nose daily., Disp: 1 each, Rfl: 12   Allergies  Allergen Reactions   Sulfa Antibiotics Hives and Other (See Comments)    Throat tightened up    Past Medical History:  Diagnosis Date   Migraine      Past Surgical History:  Procedure Laterality Date   CESAREAN SECTION      Family History  Problem Relation Age of Onset   Asthma Daughter    Hypertension Father    Colon cancer Father 22   Esophageal cancer Father        and gum cancer, smoker   Lung cancer Paternal Uncle    Bone cancer Paternal Grandfather    Seizures Paternal Grandfather    Hypertension Maternal Grandmother    Thyroid disease Paternal Grandmother    Asthma Son     Seizures Paternal Uncle     Social History   Tobacco Use   Smoking status: Never   Smokeless tobacco: Never  Vaping Use   Vaping Use: Never used  Substance Use Topics   Alcohol use: Yes    Alcohol/week: 0.0 standard drinks of alcohol    Comment: occasional wine-   Drug use: No    ROS   Objective:   Vitals: BP (!) 155/90 (BP Location: Right Arm)   Pulse 79   Temp 98.8 F (37.1 C) (Oral)   Resp 18   SpO2 96%   BP Readings from Last 3 Encounters:  03/02/22 (!) 155/90  02/11/22 126/80  12/17/21 122/84   Physical Exam Constitutional:      General: She is not in acute distress.    Appearance: Normal appearance. She is well-developed. She is not ill-appearing, toxic-appearing or diaphoretic.  HENT:     Head: Normocephalic and atraumatic.     Nose: Nose normal.     Mouth/Throat:     Mouth: Mucous membranes are moist.  Eyes:     General: No scleral icterus.       Right eye: No discharge.        Left eye: No discharge.     Extraocular Movements: Extraocular movements intact.  Cardiovascular:     Rate and Rhythm: Normal rate and regular rhythm.  Heart sounds: Normal heart sounds. No murmur heard.    No friction rub. No gallop.  Pulmonary:     Effort: Pulmonary effort is normal. No respiratory distress.     Breath sounds: No stridor. No wheezing, rhonchi or rales.  Chest:     Chest wall: No tenderness.  Skin:    General: Skin is warm and dry.  Neurological:     General: No focal deficit present.     Mental Status: She is alert and oriented to person, place, and time.  Psychiatric:        Mood and Affect: Mood normal.        Behavior: Behavior normal.    DG Cervical Spine Complete  Result Date: 03/02/2022 CLINICAL DATA:  Left shoulder pain, left upper extremity tingling for 2 weeks EXAM: CERVICAL SPINE - COMPLETE 4+ VIEW COMPARISON:  06/15/2017 FINDINGS: Frontal, bilateral oblique, lateral views of the cervical spine are obtained. Slight reversal cervical  lordosis centered at the C5-6 level. Otherwise alignment is anatomic. There are no acute fractures. Mild spondylosis at C5-6. Neural foramina are widely patent. Prevertebral soft tissues are normal. IMPRESSION: 1. Mild C5-6 spondylosis. No bony encroachment upon the central canal or neural foramina. 2. Mild reversal of cervical lordosis centered at C5-6, likely due to degenerative change. 3. No acute bony abnormality. Electronically Signed   By: Randa Ngo M.D.   On: 03/02/2022 18:40    ED ECG REPORT   Date: 03/02/2022  EKG Time: 6:12 PM  Rate: 70bpm  Rhythm: normal sinus rhythm,  unchanged from previous tracings  Axis: normal  Intervals:none  ST&T Change: none  Narrative Interpretation: Sinus rhythm at 70bpm without acute findings. Comparable to priors.   Recent Results (from the past 2160 hour(s))  [place]     Status: None   Collection Time: 02/11/22  8:36 AM  Result Value Ref Range   FSH 67.4 mIU/ML    Comment: Female Reference Range:  1.4-18.1 mIU/mLFemale Reference Range:Follicular Phase          2.5-10.2 mIU/mLMidCycle Peak          3.4-33.4 mIU/mLLuteal Phase          1.5-9.1 mIU/mLPost Menopausal     23.0-116.3 mIU/mLPregnant          <0.3 mIU/mL  VITAMIN D 25 Hydroxy (Vit-D Deficiency, Fractures)     Status: Abnormal   Collection Time: 02/11/22  8:36 AM  Result Value Ref Range   VITD 29.62 (L) 30.00 - 100.00 ng/mL  Vitamin B12     Status: None   Collection Time: 02/11/22  8:36 AM  Result Value Ref Range   Vitamin B-12 571 211 - 911 pg/mL  Comprehensive metabolic panel     Status: None   Collection Time: 02/11/22  8:36 AM  Result Value Ref Range   Sodium 138 135 - 145 mEq/L   Potassium 4.1 3.5 - 5.1 mEq/L   Chloride 102 96 - 112 mEq/L   CO2 29 19 - 32 mEq/L   Glucose, Bld 93 70 - 99 mg/dL   BUN 13 6 - 23 mg/dL   Creatinine, Ser 0.84 0.40 - 1.20 mg/dL   Total Bilirubin 0.6 0.2 - 1.2 mg/dL   Alkaline Phosphatase 93 39 - 117 U/L   AST 17 0 - 37 U/L   ALT 9 0 - 35 U/L    Total Protein 7.8 6.0 - 8.3 g/dL   Albumin 4.4 3.5 - 5.2 g/dL   GFR 82.35 >60.00 mL/min  Comment: Calculated using the CKD-EPI Creatinine Equation (2021)   Calcium 9.7 8.4 - 10.5 mg/dL  Lipid panel     Status: Abnormal   Collection Time: 02/11/22  8:36 AM  Result Value Ref Range   Cholesterol 185 0 - 200 mg/dL    Comment: ATP III Classification       Desirable:  < 200 mg/dL               Borderline High:  200 - 239 mg/dL          High:  > = 240 mg/dL   Triglycerides 62.0 0.0 - 149.0 mg/dL    Comment: Normal:  <150 mg/dLBorderline High:  150 - 199 mg/dL   HDL 61.80 >39.00 mg/dL   VLDL 12.4 0.0 - 40.0 mg/dL   LDL Cholesterol 111 (H) 0 - 99 mg/dL   Total CHOL/HDL Ratio 3     Comment:                Men          Women1/2 Average Risk     3.4          3.3Average Risk          5.0          4.42X Average Risk          9.6          7.13X Average Risk          15.0          11.0                       NonHDL 123.20     Comment: NOTE:  Non-HDL goal should be 30 mg/dL higher than patient's LDL goal (i.e. LDL goal of < 70 mg/dL, would have non-HDL goal of < 100 mg/dL)  CBC with Differential/Platelet     Status: None   Collection Time: 02/11/22  8:36 AM  Result Value Ref Range   WBC 6.6 4.0 - 10.5 K/uL   RBC 4.36 3.87 - 5.11 Mil/uL   Hemoglobin 13.8 12.0 - 15.0 g/dL   HCT 39.6 36.0 - 46.0 %   MCV 90.9 78.0 - 100.0 fl   MCHC 34.9 30.0 - 36.0 g/dL   RDW 12.8 11.5 - 15.5 %   Platelets 238.0 150.0 - 400.0 K/uL   Neutrophils Relative % 67.7 43.0 - 77.0 %   Lymphocytes Relative 21.2 12.0 - 46.0 %   Monocytes Relative 7.5 3.0 - 12.0 %   Eosinophils Relative 2.4 0.0 - 5.0 %   Basophils Relative 1.2 0.0 - 3.0 %   Neutro Abs 4.5 1.4 - 7.7 K/uL   Lymphs Abs 1.4 0.7 - 4.0 K/uL   Monocytes Absolute 0.5 0.1 - 1.0 K/uL   Eosinophils Absolute 0.2 0.0 - 0.7 K/uL   Basophils Absolute 0.1 0.0 - 0.1 K/uL  Urinalysis     Status: Abnormal   Collection Time: 02/11/22  8:36 AM  Result Value Ref Range   Color,  Urine YELLOW Yellow;Lt. Yellow;Straw;Dark Yellow;Amber;Green;Red;Brown   APPearance CLEAR Clear;Turbid;Slightly Cloudy;Cloudy   Specific Gravity, Urine 1.015 1.000 - 1.030   pH 7.5 5.0 - 8.0   Total Protein, Urine NEGATIVE Negative   Urine Glucose NEGATIVE Negative   Ketones, ur NEGATIVE Negative   Bilirubin Urine NEGATIVE Negative   Hgb urine dipstick TRACE-INTACT (A) Negative   Urobilinogen, UA 0.2 0.0 - 1.0   Leukocytes,Ua NEGATIVE Negative  Nitrite NEGATIVE Negative  TSH     Status: None   Collection Time: 02/11/22  8:36 AM  Result Value Ref Range   TSH 1.59 0.35 - 5.50 uIU/mL    Assessment and Plan :   PDMP not reviewed this encounter.  1. Left-sided chest pain   2. Numbness and tingling in left arm   3. Pain of left upper extremity     Patient has minimal heart score.  EKG is reassuring.  Recommended conservative management for musculoskeletal left-sided chest pain, suspect cervical radiculopathy for the left upper arm symptoms.  Will have patient use naproxen, tizanidine.  Emphasized need for close follow-up with PCP.  Reviewed all lab results with patient from her complete physical.  Maintain strict ER precautions. Counseled patient on potential for adverse effects with medications prescribed/recommended today, ER and return-to-clinic precautions discussed, patient verbalized understanding.    Jaynee Eagles, [place] 03/02/22 [phone]

## 2022-03-02 NOTE — ED Triage Notes (Addendum)
Pt c/o left shoulder pain, intermittent tingling LUE x 2 weeks-also tingling to both feet x 2 days-RUE aching and tingling to left fingers started today-denies injury to all c/o sites-anxiety x 2 weeks-also c/o intermittent CP x 1 week-NAD-steady gait

## 2022-03-03 ENCOUNTER — Ambulatory Visit: Payer: Managed Care, Other (non HMO) | Admitting: Internal Medicine

## 2022-03-03 ENCOUNTER — Encounter: Payer: Self-pay | Admitting: Internal Medicine

## 2022-03-03 VITALS — BP 148/88 | HR 74 | Temp 98.9°F | Ht 60.0 in | Wt 164.0 lb

## 2022-03-03 DIAGNOSIS — R079 Chest pain, unspecified: Secondary | ICD-10-CM | POA: Insufficient documentation

## 2022-03-03 DIAGNOSIS — R030 Elevated blood-pressure reading, without diagnosis of hypertension: Secondary | ICD-10-CM

## 2022-03-03 DIAGNOSIS — F419 Anxiety disorder, unspecified: Secondary | ICD-10-CM

## 2022-03-03 DIAGNOSIS — E785 Hyperlipidemia, unspecified: Secondary | ICD-10-CM

## 2022-03-03 DIAGNOSIS — R0789 Other chest pain: Secondary | ICD-10-CM

## 2022-03-03 DIAGNOSIS — N912 Amenorrhea, unspecified: Secondary | ICD-10-CM

## 2022-03-03 DIAGNOSIS — F5104 Psychophysiologic insomnia: Secondary | ICD-10-CM

## 2022-03-03 DIAGNOSIS — G4700 Insomnia, unspecified: Secondary | ICD-10-CM | POA: Insufficient documentation

## 2022-03-03 MED ORDER — NAPROXEN 500 MG PO TABS: 500.0000 mg | ORAL_TABLET | Freq: Two times a day (BID) | ORAL | 1 refills | Status: DC | PRN

## 2022-03-03 MED ORDER — FAMOTIDINE 40 MG PO TABS
40.0000 mg | ORAL_TABLET | Freq: Every day | ORAL | 1 refills | Status: DC
Start: 2022-03-03 — End: 2023-01-24

## 2022-03-03 MED ORDER — ALPRAZOLAM 0.5 MG PO TABS: 0.5000 mg | ORAL_TABLET | Freq: Two times a day (BID) | ORAL | 1 refills | Status: DC | PRN

## 2022-03-03 NOTE — Assessment & Plan Note (Addendum)
Naproxen, Pepcid Chest CT for coronary calcium Off work x 1 week

## 2022-03-03 NOTE — Assessment & Plan Note (Signed)
Catch up on sleep RTC 4 wks

## 2022-03-03 NOTE — Assessment & Plan Note (Addendum)
?  panic attack Xanax prn  Potential benefits of a long term opioids use as well as potential risks (i.e. addiction risk, apnea etc) and complications (i.e. Somnolence, constipation and others) were explained to the patient and were aknowledged.

## 2022-03-03 NOTE — Progress Notes (Signed)
Subjective:  Patient ID: Leslie Beard, female    DOB: 1975-07-25  Age: 47 y.o. MRN: [id number]  CC: No chief complaint on file.   HPI Leslie Beard presents for CP on the left x 2 weeks, L shoulder and L arm w/pain, R arm pain yesterday. Not sleeping well Pt went to [facility] last nigh, had EKG, X ray C/o anxiety, insomnia..c/o palpitations  Outpatient Medications Prior to Visit  Medication Sig Dispense Refill   Cholecalciferol (VITAMIN D3) 50 MCG (2000 UT) capsule Take 1 capsule (2,000 Units total) by mouth daily. 100 capsule 3   Multiple Vitamins-Minerals (ADULT GUMMY PO) Take by mouth daily.     rizatriptan (MAXALT) 10 MG tablet Take 1 tablet (10 mg total) by mouth once as needed for up to 1 dose for migraine. May repeat in 2 hours if needed 12 tablet 12   tiZANidine (ZANAFLEX) 4 MG tablet Take 1 tablet (4 mg total) by mouth at bedtime. 30 tablet 0   triamcinolone (NASACORT) 55 MCG/ACT AERO nasal inhaler Place 2 sprays into the nose daily. 1 each 12   naproxen (NAPROSYN) 375 MG tablet Take 1 tablet (375 mg total) by mouth 2 (two) times daily with a meal. 30 tablet 0   No facility-administered medications prior to visit.    ROS: Review of Systems  Objective:  BP (!) 148/88 (BP Location: Right Arm, Patient Position: Sitting, Cuff Size: Normal)   Pulse 74   Temp 98.9 F (37.2 C) (Oral)   Ht 5' (1.524 m)   Wt 164 lb (74.4 kg)   SpO2 99%   BMI 32.03 kg/m   BP Readings from Last 3 Encounters:  03/03/22 (!) 148/88  03/02/22 (!) 155/90  02/11/22 126/80    Wt Readings from Last 3 Encounters:  03/03/22 164 lb (74.4 kg)  02/11/22 166 lb (75.3 kg)  12/17/21 165 lb (74.8 kg)    Physical Exam Constitutional:      General: She is not in acute distress.    Appearance: She is well-developed.  HENT:     Head: Normocephalic.     Right Ear: External ear normal.     Left Ear: External ear normal.     Nose: Nose normal.  Eyes:     General:        Right eye: No discharge.         Left eye: No discharge.     Conjunctiva/sclera: Conjunctivae normal.     Pupils: Pupils are equal, round, and reactive to light.  Neck:     Thyroid: No thyromegaly.     Vascular: No JVD.     Trachea: No tracheal deviation.  Cardiovascular:     Rate and Rhythm: Normal rate and regular rhythm.     Heart sounds: Normal heart sounds.  Pulmonary:     Effort: No respiratory distress.     Breath sounds: No stridor. No wheezing.  Abdominal:     General: Bowel sounds are normal. There is no distension.     Palpations: Abdomen is soft. There is no mass.     Tenderness: There is no abdominal tenderness. There is no guarding or rebound.  Musculoskeletal:        General: No tenderness.     Cervical back: Normal range of motion and neck supple. No rigidity.  Lymphadenopathy:     Cervical: No cervical adenopathy.  Skin:    Findings: No erythema or rash.  Neurological:     Cranial Nerves: No cranial  nerve deficit.     Motor: No abnormal muscle tone.     Coordination: Coordination normal.     Deep Tendon Reflexes: Reflexes normal.  Psychiatric:        Behavior: Behavior normal.        Thought Content: Thought content normal.        Judgment: Judgment normal.    Looks tired  Lab Results  Component Value Date   WBC 6.6 02/11/2022   HGB 13.8 02/11/2022   HCT 39.6 02/11/2022   PLT 238.0 02/11/2022   GLUCOSE 93 02/11/2022   CHOL 185 02/11/2022   TRIG 62.0 02/11/2022   HDL 61.80 02/11/2022   LDLCALC 111 (H) 02/11/2022   ALT 9 02/11/2022   AST 17 02/11/2022   NA 138 02/11/2022   K 4.1 02/11/2022   CL 102 02/11/2022   CREATININE 0.84 02/11/2022   BUN 13 02/11/2022   CO2 29 02/11/2022   TSH 1.59 02/11/2022    DG Cervical Spine Complete  Result Date: 03/02/2022 CLINICAL DATA:  Left shoulder pain, left upper extremity tingling for 2 weeks EXAM: CERVICAL SPINE - COMPLETE 4+ VIEW COMPARISON:  06/15/2017 FINDINGS: Frontal, bilateral oblique, lateral views of the cervical spine are  obtained. Slight reversal cervical lordosis centered at the C5-6 level. Otherwise alignment is anatomic. There are no acute fractures. Mild spondylosis at C5-6. Neural foramina are widely patent. Prevertebral soft tissues are normal. IMPRESSION: 1. Mild C5-6 spondylosis. No bony encroachment upon the central canal or neural foramina. 2. Mild reversal of cervical lordosis centered at C5-6, likely due to degenerative change. 3. No acute bony abnormality. Electronically Signed   By: Randa Ngo M.D.   On: 03/02/2022 18:40    Assessment & Plan:   Problem List Items Addressed This Visit       Other   Insomnia    Xanax prn      Elevated blood pressure reading    Catch up on sleep RTC 4 wks      Chest pain, atypical    Naproxen, Pepcid Chest CT for coronary calcium Off work x 1 week      Anxiety    ?panic attack Xanax prn  Potential benefits of a long term opioids use as well as potential risks (i.e. addiction risk, apnea etc) and complications (i.e. Somnolence, constipation and others) were explained to the patient and were aknowledged.       Relevant Medications   ALPRAZolam (XANAX) 0.5 MG tablet   Other Visit Diagnoses     Dyslipidemia    -  Primary   Relevant Orders   CT CARDIAC SCORING ([facility] LOCATIONS ONLY)   Amenorrhea       Relevant Orders   Ambulatory referral to Gynecology         Meds ordered this encounter  Medications   ALPRAZolam (XANAX) 0.5 MG tablet    Sig: Take 1 tablet (0.5 mg total) by mouth 2 (two) times daily as needed for anxiety.    Dispense:  60 tablet    Refill:  1    Anxiety, insomnia   naproxen (NAPROSYN) 500 MG tablet    Sig: Take 1 tablet (500 mg total) by mouth 2 (two) times daily as needed for moderate pain or headache.    Dispense:  60 tablet    Refill:  1   famotidine (PEPCID) 40 MG tablet    Sig: Take 1 tablet (40 mg total) by mouth daily.    Dispense:  30 tablet  Refill:  1      Follow-up: Return in about 4 weeks (around  03/31/2022) for a follow-up visit.  Walker Kehr, MD

## 2022-03-03 NOTE — Assessment & Plan Note (Signed)
Xanax prn 

## 2022-03-03 NOTE — Patient Instructions (Signed)
Cardiac CT calcium scoring test $99    Computed tomography, more commonly known as a CT or CAT scan, is a diagnostic medical imaging test. Like traditional x-rays, it produces multiple images or pictures of the inside of the body. The cross-sectional images generated during a CT scan can be reformatted in multiple planes. They can even generate three-dimensional images. These images can be viewed on a computer monitor. CT images of internal organs, bones, soft tissue and blood vessels provide greater detail than traditional x-rays, particularly of soft tissues and blood vessels. A cardiac CT scan for coronary calcium is a non-invasive way of obtaining information about the presence, location and extent of calcified plaque in the coronary arteries--the vessels that supply oxygen-containing blood to the heart muscle. Calcified plaque results when there is a build-up of fat and other substances under the inner layer of the artery. This material can calcify which signals the presence of atherosclerosis, a disease of the vessel wall, also called coronary artery disease (CAD). People with this disease have an increased risk for heart attacks. In addition, over time, progression of plaque build up (CAD) can narrow the arteries or even close off blood flow to the heart. The result may be chest pain, sometimes called "angina," or a heart attack. Because calcium is a marker of CAD, the amount of calcium detected on a cardiac CT scan is a helpful prognostic tool. The findings on cardiac CT are expressed as a calcium score. Another name for this test is coronary artery calcium scoring.  What are some common uses of the procedure? The goal of cardiac CT scan for calcium scoring is to determine if CAD is present and to what extent, even if there are no symptoms. It is a screening study that may be recommended by a physician for patients with risk factors for CAD but no clinical symptoms. The major risk factors for CAD  are: high blood cholesterol levels  family history of heart attacks  diabetes  high blood pressure  cigarette smoking  overweight or obese  physical inactivity   A negative cardiac CT scan for calcium scoring shows no calcification within the coronary arteries. This suggests that CAD is absent or so minimal it cannot be seen by this technique. The chance of having a heart attack over the next two to five years is very low under these circumstances. A positive test means that CAD is present, regardless of whether or not the patient is experiencing any symptoms. The amount of calcification--expressed as the calcium score--may help to predict the likelihood of a myocardial infarction (heart attack) in the coming years and helps your medical doctor or cardiologist decide whether the patient may need to take preventive medicine or undertake other measures such as diet and exercise to lower the risk for heart attack. The extent of CAD is graded according to your calcium score:  Calcium Score  Presence of CAD (coronary artery disease)  0 No evidence of CAD   1-10 Minimal evidence of CAD  11-100 Mild evidence of CAD  101-400 Moderate evidence of CAD  Over 400 Extensive evidence of CAD   Coronary artery calcium (CAC) score is a strong predictor of incident coronary heart disease (CHD) and provides predictive information beyond traditional risk factors. CAC scoring is reasonable to use in the decision to withhold, postpone, or initiate statin therapy in intermediate-risk or selected borderline-risk asymptomatic adults (age 54-75 years and LDL-C >=70 to <190 mg/dL) who do not have diabetes or established atherosclerotic  cardiovascular disease (ASCVD).* In intermediate-risk (10-year ASCVD risk >=7.5% to <20%) adults or selected borderline-risk (10-year ASCVD risk >=5% to <7.5%) adults in whom a CAC score is measured for the purpose of making a treatment decision the following recommendations have  been made:   If CAC=0, it is reasonable to withhold statin therapy and reassess in 5 to 10 years, as long as higher risk conditions are absent (diabetes mellitus, family history of premature CHD in first degree relatives (males <55 years; females <65 years), cigarette smoking, or LDL >=190 mg/dL).   If CAC is 1 to 99, it is reasonable to initiate statin therapy for patients >=66 years of age.   If CAC is >=100 or >=75th percentile, it is reasonable to initiate statin therapy at any age.   Cardiology referral should be considered for patients with CAC scores >=400 or >=75th percentile.   *2018 AHA/ACC/AACVPR/AAPA/ABC/ACPM/ADA/AGS/APhA/ASPC/NLA/PCNA Guideline on the Management of Blood Cholesterol: A Report of the American College of Cardiology/American Heart Association Task Force on Clinical Practice Guidelines. J Am Coll Cardiol. 2019;73(24):3168-3209.

## 2022-03-07 ENCOUNTER — Encounter: Payer: Self-pay | Admitting: Internal Medicine

## 2022-03-31 ENCOUNTER — Ambulatory Visit: Payer: Managed Care, Other (non HMO) | Admitting: Family Medicine

## 2022-03-31 ENCOUNTER — Encounter: Payer: Self-pay | Admitting: Family Medicine

## 2022-03-31 VITALS — BP 132/84 | HR 88 | Temp 97.6°F | Ht 60.0 in | Wt 160.0 lb

## 2022-03-31 DIAGNOSIS — R051 Acute cough: Secondary | ICD-10-CM | POA: Diagnosis not present

## 2022-03-31 DIAGNOSIS — J0140 Acute pansinusitis, unspecified: Secondary | ICD-10-CM

## 2022-03-31 DIAGNOSIS — J029 Acute pharyngitis, unspecified: Secondary | ICD-10-CM | POA: Diagnosis not present

## 2022-03-31 DIAGNOSIS — R0981 Nasal congestion: Secondary | ICD-10-CM

## 2022-03-31 LAB — POC COVID19 BINAXNOW: SARS Coronavirus 2 Ag: NEGATIVE

## 2022-03-31 MED ORDER — BENZONATATE 200 MG PO CAPS: 200.0000 mg | ORAL_CAPSULE | Freq: Two times a day (BID) | ORAL | 0 refills | Status: DC | PRN

## 2022-03-31 MED ORDER — AMOXICILLIN-POT CLAVULANATE 875-125 MG PO TABS: 1.0000 | ORAL_TABLET | Freq: Two times a day (BID) | ORAL | 0 refills | Status: DC

## 2022-03-31 NOTE — Progress Notes (Signed)
Subjective:  Leslie Beard is a 47 y.o. female who presents for a 5-6 day hx of worsening URI symptoms.   Fatigue, chills, nasal congestion, sinus pressure ST, cough and purulent drainage. Some dried blood from blowing her nose.  Denies fever, chest pain, palpitations, shortness of breath, N/V/D    No other aggravating or relieving factors.  No other c/o.  ROS as in subjective.   Objective: Vitals:   03/31/22 1134  BP: 132/84  Pulse: 88  Temp: 97.6 F (36.4 C)  SpO2: 98%    General appearance: Alert, WD/WN, no distress, mildly ill appearing                             Skin: warm, no rash                           Head: no sinus tenderness                            Eyes: conjunctiva normal, corneas clear, PERRLA                            Ears: pearly TMs, external ear canals normal                          Nose: septum midline, turbinates swollen, with erythema             Mouth/throat: MMM, tongue normal, mild pharyngeal erythema                           Neck: supple, no adenopathy, no thyromegaly, nontender                          Heart: RRR                         Lungs: CTA bilaterally, no wheezes, rales, or rhonchi      Assessment: Acute non-recurrent pansinusitis - Plan: amoxicillin-clavulanate (AUGMENTIN) 875-125 MG tablet  Acute cough - Plan: POC COVID-19 BinaxNow, benzonatate (TESSALON) 200 MG capsule  Nasal congestion - Plan: POC COVID-19 BinaxNow  Acute pharyngitis, unspecified etiology - Plan: POC COVID-19 BinaxNow   Plan: Augmentin and Tessalon prescribed.   Suggested symptomatic OTC remedies. Call/return in 4-5  days if symptoms aren't resolving.

## 2022-03-31 NOTE — Patient Instructions (Signed)
Take the Augmentin with food as prescribed.    Specific home care recommendations today include: Decongestant: You may use OTC Guaifenesin (Mucinex plain) for congestion.  You may use Pseudoephedrine (Sudafed) only if you don't have blood pressure problems or a diagnosis of hypertension. Cough suppression: If you have cough from drainage, you may use over-the-counter Dextromethorphan (Delsym) as directed on the label Sore throat remedies:  You may use salt water gargles, warm fluids such as coffee or hot tea, or honey/tea/lemon mixture to sooth sore throat pain.  You may use OTC sore throat remedies such as Cepacol lozenges or Chloraseptic spray for sore throat pain. Runny nose and sneezing remedies: You may use OTC antihistamine such as Zyrtec or Benadryl, but caution as these can cause drowsiness.   Pain/fever relief: You may use over-the-counter Tylenol for pain or fever Drink extra fluids. Fluids help thin the mucus so your sinuses can drain more easily.  Applying either moist heat or ice packs to the sinus areas may help relieve discomfort. Use saline nasal sprays to help moisten your sinuses. The sprays can be found at your local drugstore.

## 2022-04-01 ENCOUNTER — Other Ambulatory Visit: Payer: Managed Care, Other (non HMO)

## 2022-04-27 ENCOUNTER — Other Ambulatory Visit: Payer: Self-pay | Admitting: Internal Medicine

## 2022-05-03 ENCOUNTER — Ambulatory Visit
Admission: RE | Admit: 2022-05-03 | Discharge: 2022-05-03 | Disposition: A | Discharge status: Home / Self Care | Payer: No Typology Code available for payment source | Source: Ambulatory Visit | Attending: Internal Medicine | Admitting: Internal Medicine

## 2022-05-03 DIAGNOSIS — E785 Hyperlipidemia, unspecified: Secondary | ICD-10-CM

## 2022-05-16 DIAGNOSIS — N951 Menopausal and female climacteric states: Secondary | ICD-10-CM | POA: Insufficient documentation

## 2022-12-05 ENCOUNTER — Encounter: Payer: Self-pay | Admitting: Gastroenterology

## 2022-12-30 ENCOUNTER — Ambulatory Visit (INDEPENDENT_AMBULATORY_CARE_PROVIDER_SITE_OTHER): Payer: Managed Care, Other (non HMO) | Admitting: Internal Medicine

## 2022-12-30 VITALS — BP 128/84 | HR 67 | Temp 98.1°F | Ht 60.0 in | Wt 167.0 lb

## 2022-12-30 DIAGNOSIS — G5601 Carpal tunnel syndrome, right upper limb: Secondary | ICD-10-CM

## 2022-12-30 NOTE — Progress Notes (Signed)
   Subjective:   Patient ID: Leslie Beard, female    DOB: [birth year], 47 y.o.   MRN: [id number]  HPI The patient is a 47 YO female coming in for right arm/hand pain. Prior carpal tunnel right hand 3-4 years ago. This feels a little different. Has not tried anything for it yet  Review of Systems  Constitutional: Negative.   HENT: Negative.    Eyes: Negative.   Respiratory:  Negative for cough, chest tightness and shortness of breath.   Cardiovascular:  Negative for chest pain, palpitations and leg swelling.  Gastrointestinal:  Negative for abdominal distention, abdominal pain, constipation, diarrhea, nausea and vomiting.  Musculoskeletal:  Positive for arthralgias and myalgias.  Skin: Negative.   Neurological: Negative.   Psychiatric/Behavioral: Negative.      Objective:  Physical Exam Constitutional:      Appearance: She is well-developed.  HENT:     Head: Normocephalic and atraumatic.  Cardiovascular:     Rate and Rhythm: Normal rate and regular rhythm.  Pulmonary:     Effort: Pulmonary effort is normal. No respiratory distress.     Breath sounds: Normal breath sounds. No wheezing or rales.  Abdominal:     General: Bowel sounds are normal. There is no distension.     Palpations: Abdomen is soft.     Tenderness: There is no abdominal tenderness. There is no rebound.  Musculoskeletal:        General: Swelling and tenderness present.     Cervical back: Normal range of motion.     Comments: Pain right wrist and lower arm to touch with minimal swelling.  Skin:    General: Skin is warm and dry.  Neurological:     Mental Status: She is alert and oriented to person, place, and time.     Coordination: Coordination normal.     Vitals:   12/30/22 0739  BP: 128/84  Pulse: 67  Temp: 98.1 F (36.7 C)  TempSrc: Oral  SpO2: 98%  Weight: 167 lb (75.8 kg)  Height: 5' (1.524 m)    Assessment & Plan:  Visit time 15 minutes in face to face communication with patient and  coordination of care, additional 5 minutes spent in record review, coordination or care, ordering tests, communicating/referring to other healthcare professionals, documenting in medical records all on the same day of the visit for total time 20 minutes spent on the visit.

## 2022-12-30 NOTE — Assessment & Plan Note (Signed)
Suspect recurrence or tendonitis. Offered meloxicam and she wishes to try otc first. Still has carpal tunnel brace encouraged to use at night time. Work has a lot of repetitive typing. Use voltaren gel otc TID to help. If no improvement she will let us know and we will rx meloxicam.

## 2022-12-30 NOTE — Patient Instructions (Signed)
I would recommend to use the brace at night time for the next couple of weeks.   The other thing you can try is voltaren gel on the area up to  2-3 times a day

## 2023-01-24 ENCOUNTER — Ambulatory Visit (AMBULATORY_SURGERY_CENTER): Payer: Managed Care, Other (non HMO)

## 2023-01-24 VITALS — Ht 60.0 in | Wt 164.0 lb

## 2023-01-24 DIAGNOSIS — Z800 Family history of malignant neoplasm of digestive organs: Secondary | ICD-10-CM

## 2023-01-24 DIAGNOSIS — Z860100 Personal history of colon polyps, unspecified: Secondary | ICD-10-CM

## 2023-01-24 MED ORDER — NA SULFATE-K SULFATE-MG SULF 17.5-3.13-1.6 GM/177ML PO SOLN: 1.0000 | Freq: Once | ORAL | 0 refills | Status: AC

## 2023-01-24 NOTE — Progress Notes (Signed)
 No egg or soy allergy known to patient  No issues known to pt with past sedation with any surgeries or procedures Patient denies ever being told they had issues or difficulty with intubation  No FH of Malignant Hyperthermia Pt is not on diet pills Pt is not on  home 02  Pt is not on blood thinners  Pt states occasional issues with constipation  No A fib or A flutter Have any cardiac testing pending--no Pt instructed to use Single[employer]com or GoodRx for a price reduction on prep  Ambulates independently Patient's chart reviewed by Norleen Schillings CNRA prior to previsit and patient appropriate for the [facility].  Previsit completed and red dot placed by patient's name on their procedure day (on provider's schedule).

## 2023-02-06 ENCOUNTER — Encounter: Payer: Self-pay | Admitting: Gastroenterology

## 2023-02-10 ENCOUNTER — Encounter: Payer: Self-pay | Admitting: Gastroenterology

## 2023-02-10 ENCOUNTER — Ambulatory Visit (AMBULATORY_SURGERY_CENTER): Payer: Managed Care, Other (non HMO) | Admitting: Gastroenterology

## 2023-02-10 VITALS — BP 127/75 | HR 72 | Temp 98.2°F | Resp 10 | Ht 60.0 in | Wt 164.0 lb

## 2023-02-10 DIAGNOSIS — K648 Other hemorrhoids: Secondary | ICD-10-CM

## 2023-02-10 DIAGNOSIS — Z1211 Encounter for screening for malignant neoplasm of colon: Secondary | ICD-10-CM | POA: Diagnosis present

## 2023-02-10 DIAGNOSIS — Z860100 Personal history of colon polyps, unspecified: Secondary | ICD-10-CM

## 2023-02-10 DIAGNOSIS — D123 Benign neoplasm of transverse colon: Secondary | ICD-10-CM | POA: Diagnosis not present

## 2023-02-10 DIAGNOSIS — Z800 Family history of malignant neoplasm of digestive organs: Secondary | ICD-10-CM

## 2023-02-10 MED ORDER — SODIUM CHLORIDE 0.9 % IV SOLN: 500.0000 mL | INTRAVENOUS | Status: DC

## 2023-02-10 NOTE — Progress Notes (Signed)
Pt states no changes to health hx since previsit

## 2023-02-10 NOTE — Progress Notes (Signed)
 Called to room to assist during endoscopic procedure.  Patient ID and intended procedure confirmed with present staff. Received instructions for my participation in the procedure from the performing physician.

## 2023-02-10 NOTE — Progress Notes (Signed)
 To pacu, VSS. Report to [email or web address]

## 2023-02-10 NOTE — Op Note (Signed)
[facility] Patient Name: Leslie Beard Procedure Date: 02/10/2023 8:04 AM MRN: [id number] Endoscopist: Napoleon Form , MD, [id number] Age: 48 Referring MD:  Date of Birth: 1975/10/23 Gender: Female Account #: [id number] Procedure:                Colonoscopy Indications:              Screening for colorectal malignant neoplasm Medicines:                Monitored Anesthesia Care Procedure:                Pre-Anesthesia Assessment:                           - Prior to the procedure, a History and Physical                            was performed, and patient medications and                            allergies were reviewed. The patient's tolerance of                            previous anesthesia was also reviewed. The risks                            and benefits of the procedure and the sedation                            options and risks were discussed with the patient.                            All questions were answered, and informed consent                            was obtained. Prior Anticoagulants: The patient has                            taken no anticoagulant or antiplatelet agents. ASA                            Grade Assessment: II - A patient with mild systemic                            disease. After reviewing the risks and benefits,                            the patient was deemed in satisfactory condition to                            undergo the procedure.                           After obtaining informed consent, the colonoscope  was passed under direct vision. Throughout the                            procedure, the patient's blood pressure, pulse, and                            oxygen saturations were monitored continuously. The                            Olympus Scope SN [id number] was introduced through the                            anus and advanced to the the cecum, identified by                             appendiceal orifice and ileocecal valve. The                            colonoscopy was performed without difficulty. The                            patient tolerated the procedure well. The quality                            of the bowel preparation was good. The ileocecal                            valve, appendiceal orifice, and rectum were                            photographed. Scope In: 8:21:07 AM Scope Out: 8:35:22 AM Scope Withdrawal Time: 0 hours 10 minutes 24 seconds  Total Procedure Duration: 0 hours 14 minutes 15 seconds  Findings:                 The perianal and digital rectal examinations were                            normal.                           A 3 mm polyp was found in the transverse colon. The                            polyp was sessile. The polyp was removed with a                            cold snare. Resection and retrieval were complete.                           Non-bleeding internal hemorrhoids were found during                            retroflexion. The hemorrhoids were small.  The exam was otherwise without abnormality. Complications:            No immediate complications. Estimated Blood Loss:     Estimated blood loss: none. Impression:               - One 3 mm polyp in the transverse colon, removed                            with a cold snare. Resected and retrieved.                           - Non-bleeding internal hemorrhoids.                           - The examination was otherwise normal. Recommendation:           - Patient has a contact number available for                            emergencies. The signs and symptoms of potential                            delayed complications were discussed with the                            patient. Return to normal activities tomorrow.                            Written discharge instructions were provided to the                            patient.                           -  Resume previous diet.                           - Continue present medications.                           - Await pathology results.                           - Repeat colonoscopy in 7-10 years for surveillance                            based on pathology results. Napoleon Form, MD 02/10/2023 8:44:03 AM This report has been signed electronically.

## 2023-02-10 NOTE — Progress Notes (Signed)
[facility] History and Physical   Primary Care Physician:  Tresa Garter, MD   Reason for Procedure:  Colorectal cancer screening  Plan:    Screening colonoscopy with possible interventions as needed     HPI: Leslie Beard is a very pleasant 48 y.o. female here for screening colonoscopy. Denies any nausea, vomiting, abdominal pain, melena or bright red blood per rectum  The risks and benefits as well as alternatives of endoscopic procedure(s) have been discussed and reviewed. All questions answered. The patient agrees to proceed.    Past Medical History:  Diagnosis Date  . Migraine     Past Surgical History:  Procedure Laterality Date  . CESAREAN SECTION    . COLONOSCOPY    . tooth cyst  2024    Prior to Admission medications   Not on File    No current outpatient medications on file.   Current Facility-Administered Medications  Medication Dose Route Frequency Provider Last Rate Last Admin  . 0.9 %  sodium chloride infusion  500 mL Intravenous Continuous Napoleon Form, MD        Allergies as of 02/10/2023 - Review Complete 02/10/2023  Allergen Reaction Noted  . Sulfa antibiotics Hives and Other (See Comments) 10/10/2016    Family History  Problem Relation Age of Onset  . Hypertension Father   . Colon cancer Father 55  . Esophageal cancer Father        and gum cancer, smoker  . Colon polyps Paternal Aunt   . Lung cancer Paternal Uncle   . Seizures Paternal Uncle   . Hypertension Maternal Grandmother   . Thyroid disease Paternal Grandmother   . Bone cancer Paternal Grandfather   . Seizures Paternal Grandfather   . Asthma Daughter   . Asthma Son   . Rectal cancer Neg Hx   . Stomach cancer Neg Hx     Social History   Socioeconomic History  . Marital status: Married    Spouse name: Not on file  . Number of children: 3  . Years of education: Not on file  . Highest education level: Bachelor's degree (e.g., BA, AB, BS)   Occupational History  . 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 Smoking status: Never  . Smokeless tobacco: Never  Vaping Use  . Vaping status: Never Used  Substance and Sexual Activity  . Alcohol use: Yes    Comment: occasional wine  . Drug use: No  . Sexual activity: Yes    Birth control/protection: Pill  Other Topics Concern  . Not on file  Social History Narrative   Regular Exercise -  NO   Married 2 kids and a step-daughter         Social Drivers of Health   Financial Resource Strain: Low Risk  (12/30/2022)   Overall Financial Resource Strain (CARDIA)   . Difficulty of Paying Living Expenses: Not hard at all  Food Insecurity: No Food Insecurity (12/30/2022)   Hunger Vital Sign   . Worried About Programme researcher, broadcasting/film/video in the Last Year: Never true   . Ran Out of Food in the Last Year: Never true  Transportation Needs: No Transportation Needs (12/30/2022)   PRAPARE - Transportation   . Lack of Transportation (Medical): No   . Lack of Transportation (Non-Medical): No  Physical Activity: Insufficiently Active (12/30/2022)   Exercise Vital Sign   . Days of Exercise per Week: 1 day   . Minutes of Exercise  per Session: 30 min  Stress: Not on file  Social Connections: Socially Integrated (12/30/2022)   Social Connection and Isolation Panel [NHANES]   . Frequency of Communication with Friends and Family: More than three times a week   . Frequency of Social Gatherings with Friends and Family: Twice a week   . Attends Religious Services: More than 4 times per year   . Active Member of Clubs or Organizations: Yes   . Attends Banker Meetings: 1 to 4 times per year   . Marital Status: Married  Catering manager Violence: Not on file    Review of Systems:  All other review of systems negative except as mentioned in the HPI.  Physical Exam: Vital signs in last 24 hours: BP (!) 157/78   Pulse 83   Temp 98.2 F (36.8 C)   Ht 5' (1.524 m)   Wt  164 lb (74.4 kg)   SpO2 100%   BMI 32.03 kg/m  General:   Alert, NAD Lungs:  Clear .   Heart:  Regular rate and rhythm Abdomen:  Soft, nontender and nondistended. Neuro/Psych:  Alert and cooperative. Normal mood and affect. A and O x 3  Reviewed labs, radiology imaging, old records and pertinent past GI work up  Patient is appropriate for planned procedure(s) and anesthesia in an ambulatory setting   K. Scherry Ran , MD [phone]

## 2023-02-10 NOTE — Patient Instructions (Signed)
 Resume previous diet and medications.  Handout provided on polyps.  Follow up colonoscopy based on biopsy results.    YOU HAD AN ENDOSCOPIC PROCEDURE TODAY AT THE [facility]:   Refer to the procedure report that was given to you for any specific questions about what was found during the examination.  If the procedure report does not answer your questions, please call your gastroenterologist to clarify.  If you requested that your care partner not be given the details of your procedure findings, then the procedure report has been included in a sealed envelope for you to review at your convenience later.  YOU SHOULD EXPECT: Some feelings of bloating in the abdomen. Passage of more gas than usual.  Walking can help get rid of the air that was put into your GI tract during the procedure and reduce the bloating. If you had a lower endoscopy (such as a colonoscopy or flexible sigmoidoscopy) you may notice spotting of blood in your stool or on the toilet paper. If you underwent a bowel prep for your procedure, you may not have a normal bowel movement for a few days.  Please Note:  You might notice some irritation and congestion in your nose or some drainage.  This is from the oxygen used during your procedure.  There is no need for concern and it should clear up in a day or so.  SYMPTOMS TO REPORT IMMEDIATELY:  Following lower endoscopy (colonoscopy or flexible sigmoidoscopy):  Excessive amounts of blood in the stool  Significant tenderness or worsening of abdominal pains  Swelling of the abdomen that is new, acute  Fever of 100F or higher  For urgent or emergent issues, a gastroenterologist can be reached at any hour by calling [phone]. Do not use MyChart messaging for urgent concerns.    DIET:  We do recommend a small meal at first, but then you may proceed to your regular diet.  Drink plenty of fluids but you should avoid alcoholic beverages for 24 hours.  ACTIVITY:  You  should plan to take it easy for the rest of today and you should NOT DRIVE or use heavy machinery until tomorrow (because of the sedation medicines used during the test).    FOLLOW UP: Our staff will call the number listed on your records the next business day following your procedure.  We will call around 7:15- 8:00 am to check on you and address any questions or concerns that you may have regarding the information given to you following your procedure. If we do not reach you, we will leave a message.     If any biopsies were taken you will be contacted by phone or by letter within the next 1-3 weeks.  Please call us at [phone] if you have not heard about the biopsies in 3 weeks.    SIGNATURES/CONFIDENTIALITY: You and/or your care partner have signed paperwork which will be entered into your electronic medical record.  These signatures attest to the fact that that the information above on your After Visit Summary has been reviewed and is understood.  Full responsibility of the confidentiality of this discharge information lies with you and/or your care-partner.

## 2023-02-13 ENCOUNTER — Telehealth: Payer: Self-pay

## 2023-02-13 NOTE — Telephone Encounter (Signed)
 Left message on follow up call.

## 2023-02-15 LAB — SURGICAL PATHOLOGY

## 2023-03-12 ENCOUNTER — Encounter: Payer: Self-pay | Admitting: Gastroenterology

## 2023-06-01 IMAGING — CR DG CHEST 2V
2 series · 2 of 2 positions shown · non-contrast
Comparison: 02/07/2012.

CLINICAL DATA: Chest pain and headache.  Light sensitivity.

EXAM:
CHEST - 2 VIEW

[w chest pa]
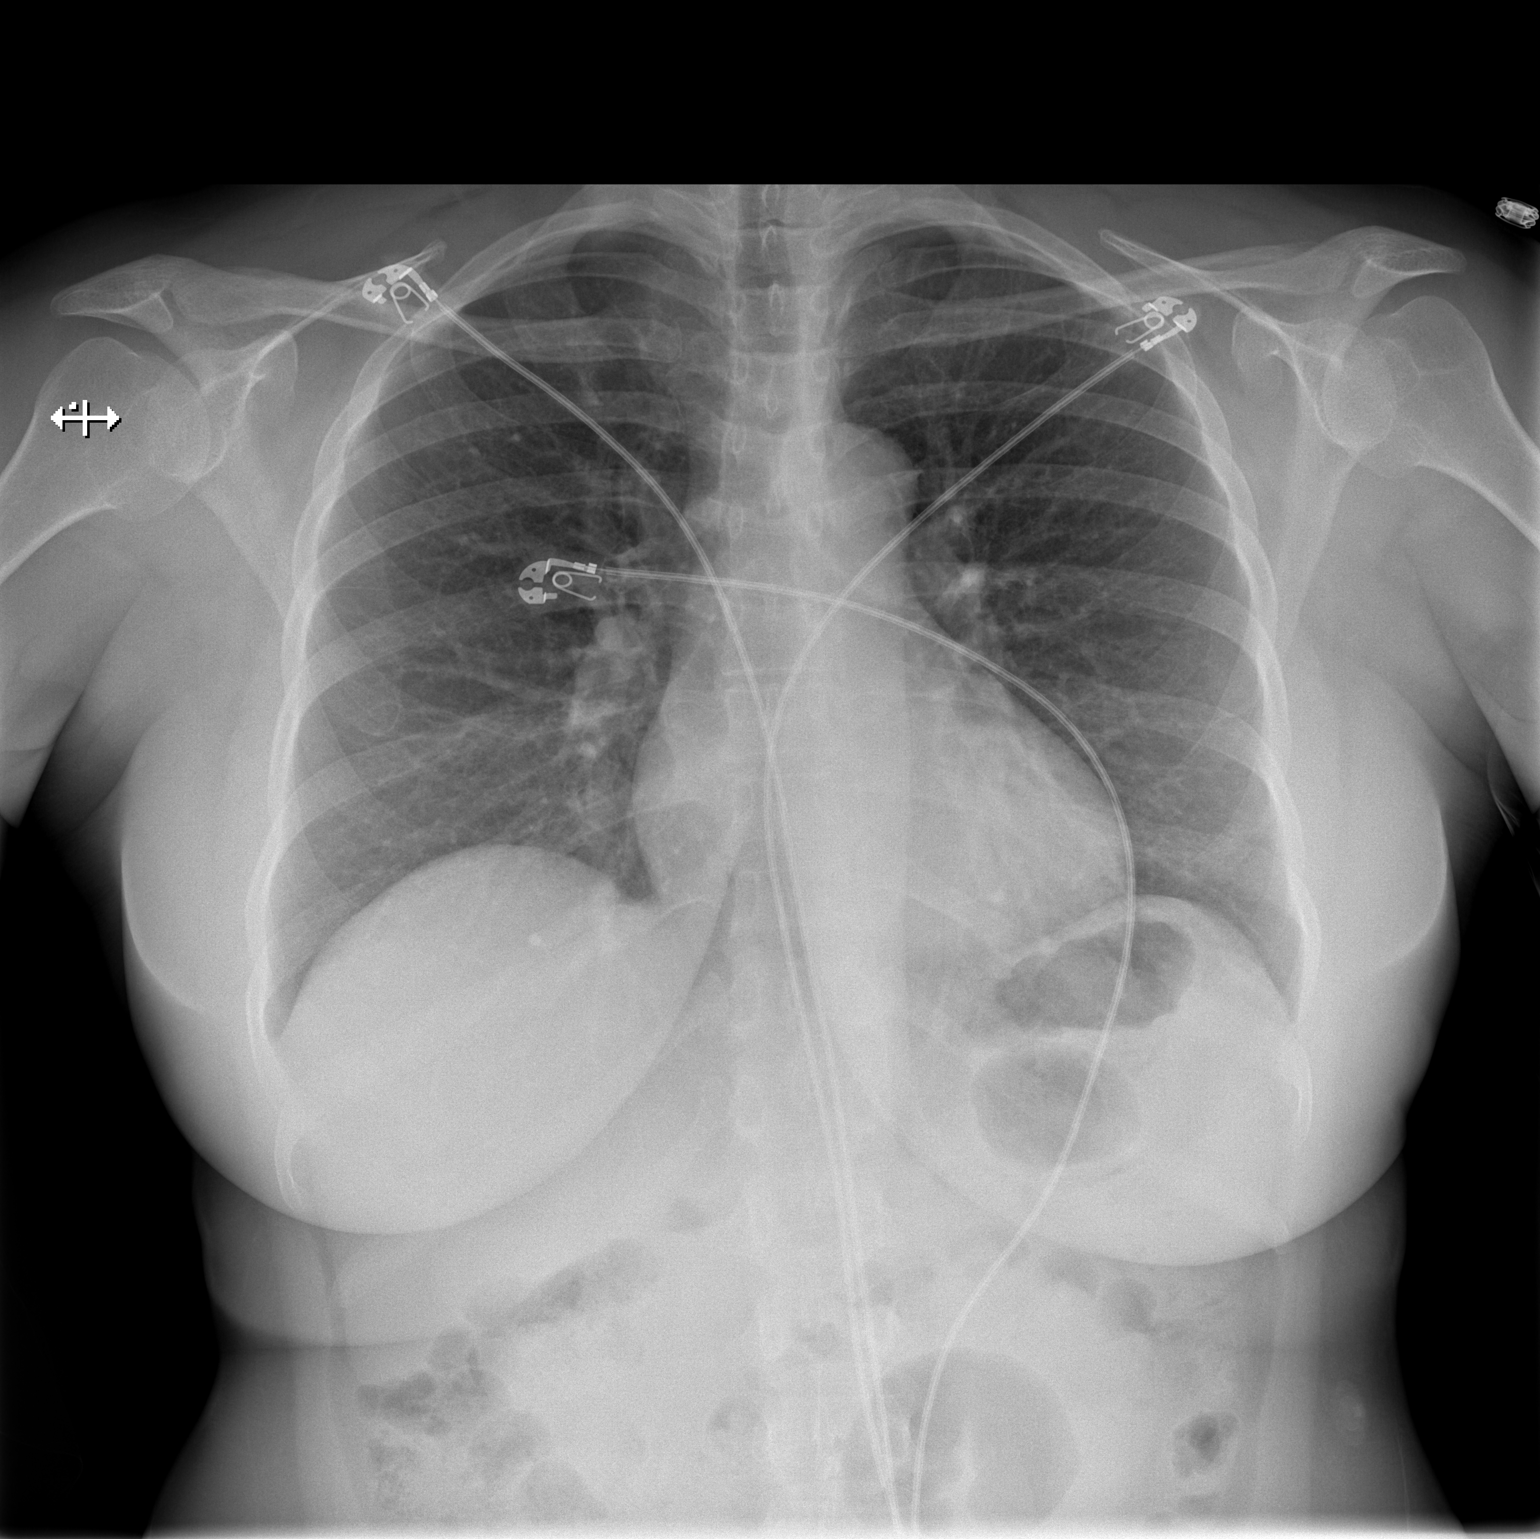

[w chest lat]
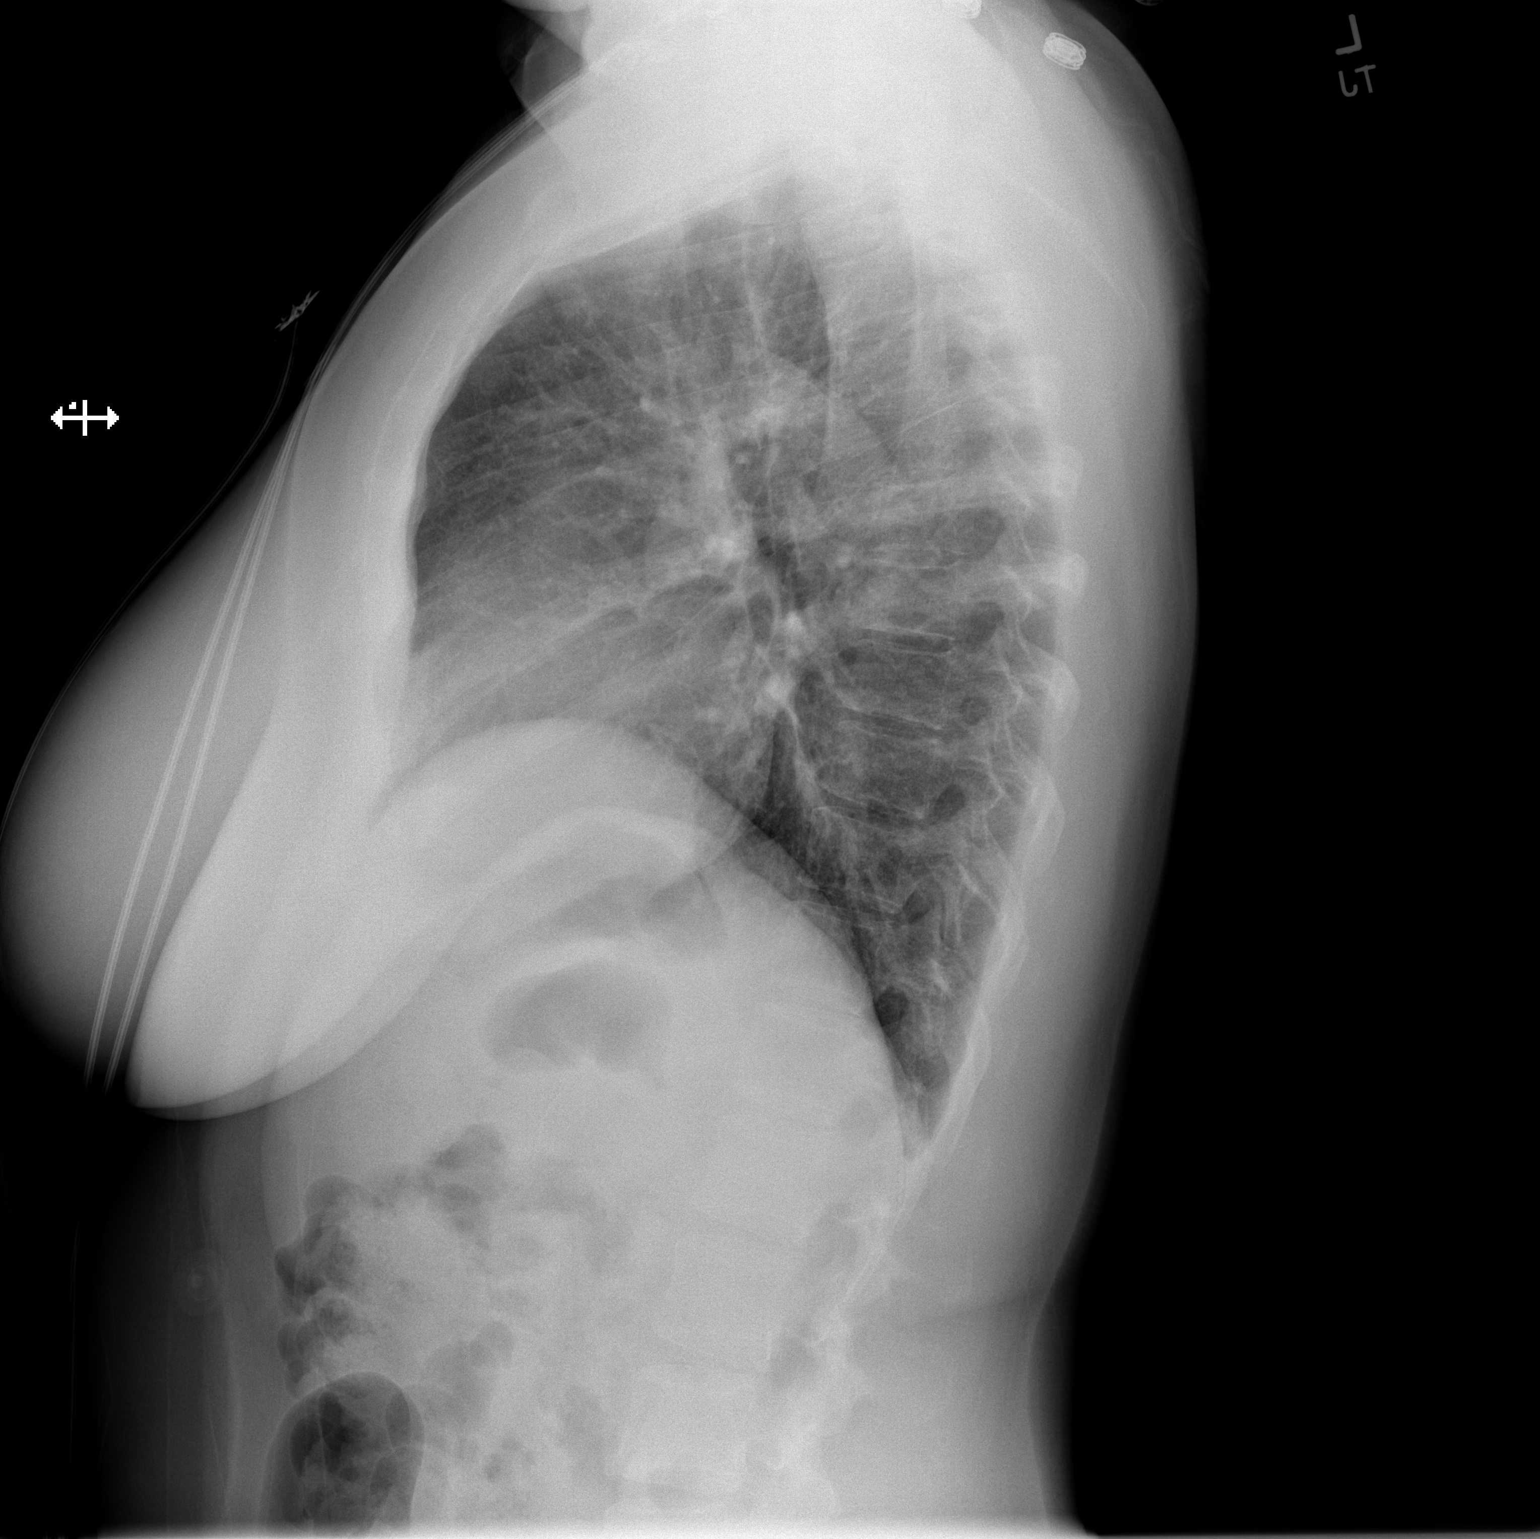

[2 of 2 positions shown; findings below may reference images not displayed]

FINDINGS: Trachea is midline. Heart size normal. Lungs are clear. No pleural
fluid.
IMPRESSION: No acute findings.

## 2023-06-04 NOTE — Progress Notes (Unsigned)
   Acute Office Visit  Subjective:     Patient ID: Mabrey P Roulhac, female    DOB: Dec 12, 1975, 48 y.o.   MRN: [id number]  No chief complaint on file.   HPI Patient is in today for evaluation of abdominal pain for the last 4 days. Reports that her stomach feels hard to her.  ROS Per HPI      Objective:    There were no vitals taken for this visit.   Physical Exam Vitals and nursing note reviewed.  Constitutional:      General: She is not in acute distress.    Appearance: Normal appearance. She is normal weight.  HENT:     Head: Normocephalic and atraumatic.     Right Ear: External ear normal.     Left Ear: External ear normal.     Nose: Nose normal.     Mouth/Throat:     Mouth: Mucous membranes are moist.     Pharynx: Oropharynx is clear.  Eyes:     Extraocular Movements: Extraocular movements intact.     Pupils: Pupils are equal, round, and reactive to light.  Cardiovascular:     Rate and Rhythm: Normal rate and regular rhythm.     Pulses: Normal pulses.     Heart sounds: Normal heart sounds.  Pulmonary:     Effort: Pulmonary effort is normal. No respiratory distress.     Breath sounds: Normal breath sounds. No wheezing, rhonchi or rales.  Musculoskeletal:        General: Normal range of motion.     Cervical back: Normal range of motion.     Right lower leg: No edema.     Left lower leg: No edema.  Lymphadenopathy:     Cervical: No cervical adenopathy.  Neurological:     General: No focal deficit present.     Mental Status: She is alert and oriented to person, place, and time.  Psychiatric:        Mood and Affect: Mood normal.        Thought Content: Thought content normal.     No results found for any visits on 06/06/23.      Assessment & Plan:   Generalized abdominal pain     No orders of the defined types were placed in this encounter.   No follow-ups on file.  Wellington Half, FNP

## 2023-06-06 ENCOUNTER — Ambulatory Visit: Admitting: Family Medicine

## 2023-06-06 ENCOUNTER — Ambulatory Visit (INDEPENDENT_AMBULATORY_CARE_PROVIDER_SITE_OTHER)

## 2023-06-06 ENCOUNTER — Encounter: Payer: Self-pay | Admitting: Family Medicine

## 2023-06-06 VITALS — BP 128/84 | HR 75 | Temp 98.7°F | Ht 60.0 in | Wt 171.8 lb

## 2023-06-06 DIAGNOSIS — R1084 Generalized abdominal pain: Secondary | ICD-10-CM

## 2023-06-06 DIAGNOSIS — R140 Abdominal distension (gaseous): Secondary | ICD-10-CM

## 2023-06-06 NOTE — Patient Instructions (Addendum)
 We are getting an xray today. We will be in contact with any abnormal results that require further attention.  May go ahead and try smooth move tea.  Follow-up with me for new or worsening symptoms.

## 2023-06-15 ENCOUNTER — Ambulatory Visit: Payer: Self-pay | Admitting: Family Medicine

## 2023-06-20 ENCOUNTER — Ambulatory Visit (INDEPENDENT_AMBULATORY_CARE_PROVIDER_SITE_OTHER): Admitting: Internal Medicine

## 2023-06-20 ENCOUNTER — Encounter: Payer: Self-pay | Admitting: Internal Medicine

## 2023-06-20 VITALS — BP 118/70 | HR 63 | Temp 99.1°F | Ht 60.0 in | Wt 171.0 lb

## 2023-06-20 DIAGNOSIS — Z0000 Encounter for general adult medical examination without abnormal findings: Secondary | ICD-10-CM

## 2023-06-20 DIAGNOSIS — E559 Vitamin D deficiency, unspecified: Secondary | ICD-10-CM | POA: Diagnosis not present

## 2023-06-20 DIAGNOSIS — Z13220 Encounter for screening for lipoid disorders: Secondary | ICD-10-CM

## 2023-06-20 DIAGNOSIS — E538 Deficiency of other specified B group vitamins: Secondary | ICD-10-CM | POA: Diagnosis not present

## 2023-06-20 NOTE — Assessment & Plan Note (Addendum)
 Not taking B12 Check Vitamin B12 levels

## 2023-06-20 NOTE — Assessment & Plan Note (Signed)
 We discussed age appropriate health related issues, including available/recomended screening tests and vaccinations. We discussed a need for adhering to healthy diet and exercise. Labs/EKG were reviewed/ordered. All questions were answered. 04/2022 coronary calcium CT score is 0. Colonoscopy 01/2023 Dr Leonia Raman (1 polyp) due in 2032 GYN q 12 mo Pelvic US  06/2023

## 2023-06-20 NOTE — Progress Notes (Signed)
 Long-leg with 70 Last Week Pressure Minute Plan  Subjective:  Patient ID: Breasia P Scala, female    DOB: 03-Nov-1975  Age: 48 y.o. MRN: [id number]  CC: Annual Exam   HPI Kassidy P Hoehn presents for a well exam Pt had some LLQ pain  Outpatient Medications Prior to Visit  Medication Sig Dispense Refill   amoxicillin  (AMOXIL ) 500 MG tablet TAKE 1 BY MOUTH THREE TIMES A DAY UNTIL GONE     HYDROcodone -acetaminophen (NORCO) 10-325 MG tablet Take 1 tablet by mouth every 6 (six) hours as needed.     Na Sulfate-K Sulfate-Mg Sulfate concentrate (SUPREP) 17.5-3.13-1.6 GM/177ML SOLN See admin instructions.     No facility-administered medications prior to visit.    ROS: Review of Systems  Constitutional:  Negative for activity change, appetite change, chills, fatigue and unexpected weight change.  HENT:  Negative for congestion, mouth sores and sinus pressure.   Eyes:  Negative for visual disturbance.  Respiratory:  Negative for cough and chest tightness.   Gastrointestinal:  Negative for abdominal pain and nausea.  Genitourinary:  Negative for difficulty urinating, frequency and vaginal pain.  Musculoskeletal:  Negative for back pain and gait problem.  Skin:  Negative for pallor and rash.  Neurological:  Negative for dizziness, tremors, weakness, numbness and headaches.  Psychiatric/Behavioral:  Negative for confusion, sleep disturbance and suicidal ideas.     Objective:  BP 118/70   Pulse 63   Temp 99.1 F (37.3 C) (Oral)   Ht 5' (1.524 m)   Wt 171 lb (77.6 kg)   LMP 06/01/2017   SpO2 98%   BMI 33.40 kg/m   BP Readings from Last 3 Encounters:  06/20/23 118/70  06/06/23 128/84  02/10/23 127/75    Wt Readings from Last 3 Encounters:  06/20/23 171 lb (77.6 kg)  06/06/23 171 lb 12.8 oz (77.9 kg)  02/10/23 164 lb (74.4 kg)    Physical Exam Constitutional:      General: She is not in acute distress.    Appearance: She is well-developed. She is not ill-appearing.  HENT:      Head: Normocephalic.     Right Ear: External ear normal.     Left Ear: External ear normal.     Nose: Nose normal.  Eyes:     General:        Right eye: No discharge.        Left eye: No discharge.     Conjunctiva/sclera: Conjunctivae normal.     Pupils: Pupils are equal, round, and reactive to light.  Neck:     Thyroid : No thyromegaly.     Vascular: No JVD.     Trachea: No tracheal deviation.  Cardiovascular:     Rate and Rhythm: Normal rate and regular rhythm.     Heart sounds: Normal heart sounds.  Pulmonary:     Effort: No respiratory distress.     Breath sounds: No stridor. No wheezing.  Abdominal:     General: Bowel sounds are normal. There is no distension.     Palpations: Abdomen is soft. There is no mass.     Tenderness: There is no abdominal tenderness. There is no guarding or rebound.  Musculoskeletal:        General: No tenderness.     Cervical back: Normal range of motion and neck supple. No rigidity.  Lymphadenopathy:     Cervical: No cervical adenopathy.  Skin:    Findings: No erythema or rash.  Neurological:  Cranial Nerves: No cranial nerve deficit.     Motor: No abnormal muscle tone.     Coordination: Coordination normal.     Deep Tendon Reflexes: Reflexes normal.  Psychiatric:        Behavior: Behavior normal.        Thought Content: Thought content normal.        Judgment: Judgment normal.     Lab Results  Component Value Date   WBC 6.6 02/11/2022   HGB 13.8 02/11/2022   HCT 39.6 02/11/2022   PLT 238.0 02/11/2022   GLUCOSE 93 02/11/2022   CHOL 185 02/11/2022   TRIG 62.0 02/11/2022   HDL 61.80 02/11/2022   LDLCALC 111 (H) 02/11/2022   ALT 9 02/11/2022   AST 17 02/11/2022   NA 138 02/11/2022   K 4.1 02/11/2022   CL 102 02/11/2022   CREATININE 0.84 02/11/2022   BUN 13 02/11/2022   CO2 29 02/11/2022   TSH 1.59 02/11/2022    CT CARDIAC SCORING ([facility] LOCATIONS ONLY) Result Date: 05/05/2022 CLINICAL DATA:  48 year old African American  female, coronary calcium screening. * Tracking Code: FCC * EXAM: CT CARDIAC CORONARY ARTERY CALCIUM SCORE TECHNIQUE: Non-contrast imaging through the heart was performed using prospective ECG gating. Image post processing was performed on an independent workstation, allowing for quantitative analysis of the heart and coronary arteries. Note that this exam targets the heart and the chest was not imaged in its entirety. COMPARISON:  None Available. FINDINGS: CORONARY CALCIUM SCORES: Left Main: 0 LAD: 0 LCx: 0 RCA: 0 Total Agatston Score: 0 MESA database percentile: Zeroth AORTA MEASUREMENTS: Ascending Aorta: 30 mm Descending Aorta: 24 mm OTHER FINDINGS: Cardiovascular: The heart is normal in size. Mediastinum/Nodes: Visualized esophagus and trachea within normal limits. No mediastinal lymphadenopathy. Lungs/Pleura: The lungs are clear bilaterally. Upper Abdomen: The visualized upper abdomen is within normal limits. Musculoskeletal: No acute osseous abnormality. IMPRESSION: No coronary atherosclerotic calcifications. The observed calcium score of 0 is at the zeroth percentile for subjects of the same age, gender, and race/ethnicity who are free of clinical cardiovascular disease and treated diabetes. Creasie Doctor, MD [facility] Electronically Signed   By: Creasie Doctor M.D.   On: 05/05/2022 09:12    Assessment & Plan:   Problem List Items Addressed This Visit     Well adult exam   We discussed age appropriate health related issues, including available/recomended screening tests and vaccinations. We discussed a need for adhering to healthy diet and exercise. Labs/EKG were reviewed/ordered. All questions were answered. 04/2022 coronary calcium CT score is 0. Colonoscopy 01/2023 Dr Leonia Raman (1 polyp) due in 2032 GYN q 12 mo Pelvic US  06/2023      Relevant Orders   TSH   Urinalysis   CBC with Differential/Platelet   Lipid panel   Comprehensive  metabolic panel with GFR   B12 deficiency - Primary   Not taking B12 Check Vitamin B12 levels       Relevant Orders   Vitamin B12   Vitamin D  deficiency   Check Vitamin D  levels Re-start Vit D      Relevant Orders   VITAMIN D  25 Hydroxy (Vit-D Deficiency, Fractures)      No orders of the defined types were placed in this encounter.     Follow-up: Return in about 1 year (around 06/19/2024) for Wellness Exam.  Anitra Barn, MD

## 2023-06-20 NOTE — Assessment & Plan Note (Signed)
 Check Vitamin D  levels Re-start Vit D

## 2023-07-05 ENCOUNTER — Ambulatory Visit: Payer: Self-pay | Admitting: Internal Medicine

## 2023-07-05 DIAGNOSIS — E538 Deficiency of other specified B group vitamins: Secondary | ICD-10-CM

## 2023-07-05 DIAGNOSIS — E559 Vitamin D deficiency, unspecified: Secondary | ICD-10-CM

## 2023-07-05 MED ORDER — VITAMIN D (ERGOCALCIFEROL) 1.25 MG (50000 UNIT) PO CAPS: 50000.0000 [IU] | ORAL_CAPSULE | ORAL | 0 refills | Status: AC

## 2023-07-05 MED ORDER — B12 5000 MCG SL SUBL: 5000.0000 ug | SUBLINGUAL_TABLET | Freq: Every day | SUBLINGUAL | 3 refills | Status: AC

## 2023-07-05 MED ORDER — VITAMIN D3 50 MCG (2000 UT) PO CAPS
2000.0000 [IU] | ORAL_CAPSULE | Freq: Every day | ORAL | 3 refills | Status: AC
Start: 2023-07-05 — End: ?

## 2023-07-05 NOTE — Assessment & Plan Note (Signed)
 Re--start Vit D prescription 50000 iu weekly followed by over-the-counter Vit D 2000 iu daily.

## 2023-07-05 NOTE — Assessment & Plan Note (Signed)
Restart vitamin B12 

## 2023-09-07 ENCOUNTER — Encounter: Payer: Self-pay | Admitting: Emergency Medicine

## 2023-09-07 ENCOUNTER — Ambulatory Visit: Admitting: Emergency Medicine

## 2023-09-07 ENCOUNTER — Ambulatory Visit (INDEPENDENT_AMBULATORY_CARE_PROVIDER_SITE_OTHER)

## 2023-09-07 ENCOUNTER — Ambulatory Visit: Payer: Self-pay

## 2023-09-07 VITALS — BP 162/100 | HR 73 | Temp 98.3°F | Ht 60.0 in | Wt 173.0 lb

## 2023-09-07 DIAGNOSIS — R079 Chest pain, unspecified: Secondary | ICD-10-CM

## 2023-09-07 NOTE — Patient Instructions (Signed)
Nonspecific Chest Pain Chest pain can be caused by many different conditions. Some causes of chest pain can be life-threatening. These will require treatment right away. Serious causes of chest pain include: Heart attack. A tear in the body's main blood vessel. Redness and swelling (inflammation) around your heart. Blood clot in your lungs. Other causes of chest pain may not be so serious. These include: Heartburn. Anxiety or stress. Damage to bones or muscles in your chest. Lung infections. Chest pain can feel like: Pain or discomfort in your chest. Crushing, pressure, aching, or squeezing pain. Burning or tingling. Dull or sharp pain that is worse when you move, cough, or take a deep breath. Pain or discomfort that is also felt in your back, neck, jaw, shoulder, or arm, or pain that spreads to any of these areas. It is hard to know whether your pain is caused by something that is serious or something that is not so serious. So it is important to see your doctor right away if you have chest pain. Follow these instructions at home: Medicines Take over-the-counter and prescription medicines only as told by your doctor. If you were prescribed an antibiotic medicine, take it as told by your doctor. Do not stop taking the antibiotic even if you start to feel better. Lifestyle  Rest as told by your doctor. Do not use any products that contain nicotine or tobacco, such as cigarettes, e-cigarettes, and chewing tobacco. If you need help quitting, ask your doctor. Do not drink alcohol. Make lifestyle changes as told by your doctor. These may include: Getting regular exercise. Ask your doctor what activities are safe for you. Eating a heart-healthy diet. A diet and nutrition specialist (dietitian) can help you to learn healthy eating options. Staying at a healthy weight. Treating diabetes or high blood pressure, if needed. Lowering your stress. Activities such as yoga and relaxation techniques  can help. General instructions Pay attention to any changes in your symptoms. Tell your doctor about them or any new symptoms. Avoid any activities that cause chest pain. Keep all follow-up visits as told by your doctor. This is important. You may need more testing if your chest pain does not go away. Contact a doctor if: Your chest pain does not go away. You feel depressed. You have a fever. Get help right away if: Your chest pain is worse. You have a cough that gets worse, or you cough up blood. You have very bad (severe) pain in your belly (abdomen). You pass out (faint). You have either of these for no clear reason: Sudden chest discomfort. Sudden discomfort in your arms, back, neck, or jaw. You have shortness of breath at any time. You suddenly start to sweat, or your skin gets clammy. You feel sick to your stomach (nauseous). You throw up (vomit). You suddenly feel lightheaded or dizzy. You feel very weak or tired. Your heart starts to beat fast, or it feels like it is skipping beats. These symptoms may be an emergency. Do not wait to see if the symptoms will go away. Get medical help right away. Call your local emergency services (911 in the [place] Do not drive yourself to the hospital. Summary Chest pain can be caused by many different conditions. The cause may be serious and need treatment right away. If you have chest pain, see your doctor right away. Follow your doctor's instructions for taking medicines and making lifestyle changes. Keep all follow-up visits as told by your doctor. This includes visits for any further   testing if your chest pain does not go away. Be sure to know the signs that show that your condition has become worse. Get help right away if you have these symptoms. This information is not intended to replace advice given to you by your health care provider. Make sure you discuss any questions you have with your health care provider. Document Revised:  11/11/2021 Document Reviewed: 11/11/2021 Elsevier Patient Education  2024 Elsevier Inc.  

## 2023-09-07 NOTE — Telephone Encounter (Signed)
 FYI Only or Action Required?: FYI only for provider.  Patient was last seen in primary care on 06/20/2023 by Plotnikov, Karlynn GAILS, MD.  Called Nurse Triage reporting Chest Pain.  Symptoms began yesterday.  Interventions attempted: OTC medications: Tums.  Symptoms are: unchanged.  Triage Disposition: See Physician Within 24 Hours  Patient/caregiver understands and will follow disposition?: Yes       Copied from CRM [id number]. Topic: Clinical - Red Word Triage >> Sep 07, 2023 10:14 AM Terri MATSU wrote: [facility] that prompted transfer to Nurse Triage: Patient stated she has been feeling chest pains (like chest pitches here and there) since yesterday. Reason for Disposition  [1] Chest pain lasts < 5 minutes AND [2] NO chest pain or cardiac symptoms (e.g., breathing difficulty, sweating) now  (Exception: Chest pains that last only a few seconds.)  Answer Assessment - Initial Assessment Questions 1. LOCATION: Where does it hurt?       L side chest, under breast 2. RADIATION: Does the pain go anywhere else? (e.g., into neck, jaw, arms, back)     Denies Does endorse stomach pain 3. ONSET: When did the chest pain begin? (Minutes, hours or days)      Yesterday with pinchy feeling 4. PATTERN: Does the pain come and go, or has it been constant since it started?  Does it get worse with exertion?      Comes and goes 5. DURATION: How long does it last (e.g., seconds, minutes, hours)     seconds 6. SEVERITY: How bad is the pain?  (e.g., Scale 1-10; mild, moderate, or severe)     5/10 7. CARDIAC RISK FACTORS: Do you have any history of heart problems or risk factors for heart disease? (e.g., angina, prior heart attack; diabetes, high blood pressure, high cholesterol, smoker, or strong family history of heart disease)     Endorses high cholesterol 8. PULMONARY RISK FACTORS: Do you have any history of lung disease?  (e.g., blood clots in lung, asthma, emphysema, birth  control pills)     denies 9. CAUSE: What do you think is causing the chest pain?     unknown 10. OTHER SYMPTOMS: Do you have any other symptoms? (e.g., dizziness, nausea, vomiting, sweating, fever, difficulty breathing, cough)       denies 11. PREGNANCY: Is there any chance you are pregnant? When was your last menstrual period?       N/a  Protocols used: Chest Pain-A-AH

## 2023-09-07 NOTE — Assessment & Plan Note (Signed)
 Clinically stable.  No red flag signs or symptoms. CT cardiac scoring test dated 05/03/2022 has score of 0.  Heart is normal in size. Recent blood work from 06/20/2023 shows normal labs except for low vitamin B12 level EKG today within normal limits.  No acute ischemic changes Unremarkable physical examination Chest x-ray today normal Very low suspicion for cardiac event ED precautions given Recommend follow-up with PCP and cardiologist

## 2023-09-07 NOTE — Progress Notes (Signed)
 Leslie Beard 48 y.o.   Chief Complaint  Patient presents with   Chest Pain    Patient here for pinching feeling in her chest under her left breast area that started yesterday. She states it comes and goes. She says on the way to this appt she felt a bubble  also had upper back pain. She says she's has been having a bowel movement every time she goes to the bathroom which is not normal for her     HISTORY OF PRESENT ILLNESS: This is a 48 y.o. female complaining of pinching pain under the left breast on and off since yesterday around 3 in the afternoon Started while she was at work. No radiation and no associated symptoms No other complaints or medical concerns today.  Chest Pain  Associated symptoms include back pain. Pertinent negatives include no abdominal pain, cough, dizziness, fever, headaches, nausea, palpitations, shortness of breath or vomiting.     Prior to Admission medications   Medication Sig Start Date End Date Taking? Authorizing Provider  Cholecalciferol (VITAMIN D3) 50 MCG (2000 UT) capsule Take 1 capsule (2,000 Units total) by mouth daily. 07/05/23  Yes Plotnikov, Aleksei V, MD  Methylcobalamin (B12) 5000 MCG SUBL Place 5,000 mcg under the tongue daily. 07/05/23  Yes Plotnikov, Karlynn GAILS, MD  Vitamin D , Ergocalciferol , (DRISDOL ) 1.25 MG (50000 UNIT) CAPS capsule Take 1 capsule (50,000 Units total) by mouth every 7 (seven) days. 07/05/23  Yes Plotnikov, Karlynn GAILS, MD    Allergies  Allergen Reactions   Sulfa Antibiotics Hives and Other (See Comments)    Throat tightened up    Patient Active Problem List   Diagnosis Date Noted   Menopausal syndrome 05/16/2022   Anxiety 03/03/2022   Nonspecific chest pain 03/03/2022   Insomnia 03/03/2022   CTS (carpal tunnel syndrome) 01/25/2018   Family history of colon cancer 07/05/2016   Vitamin D  deficiency 10/21/2014   Postherpetic neuralgia 10/17/2014   Lipoma of abdominal wall 09/09/2014   B12 deficiency 07/29/2013    Headache disorder 01/10/2011   Migraine headache 01/10/2011    Past Medical History:  Diagnosis Date   Migraine     Past Surgical History:  Procedure Laterality Date   CESAREAN SECTION     COLONOSCOPY     tooth cyst  2024    Social History   Socioeconomic History   Marital status: Married    Spouse name: Not on file   Number of children: 3   Years of education: Not on file   Highest education level: Bachelor's degree (e.g., BA, AB, BS)  Occupational History   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  Tobacco Use   Smoking status: Never   Smokeless tobacco: Never  Vaping Use   Vaping status: Never Used  Substance and Sexual Activity   Alcohol use: Yes    Comment: occasional wine   Drug use: No   Sexual activity: Yes    Birth control/protection: Pill  Other Topics Concern   Not on file  Social History Narrative   Regular Exercise -  NO   Married 2 kids and a step-daughter         Social Drivers of Health   Financial Resource Strain: Low Risk  (12/30/2022)   Overall Financial Resource Strain (CARDIA)    Difficulty of Paying Living Expenses: Not hard at all  Food Insecurity: No Food Insecurity (12/30/2022)   Hunger Vital Sign    Worried About Running Out of Food in the Last  Year: Never true    Ran Out of Food in the Last Year: Never true  Transportation Needs: No Transportation Needs (12/30/2022)   PRAPARE - Administrator, Civil Service (Medical): No    Lack of Transportation (Non-Medical): No  Physical Activity: Insufficiently Active (12/30/2022)   Exercise Vital Sign    Days of Exercise per Week: 1 day    Minutes of Exercise per Session: 30 min  Stress: Not on file  Social Connections: Socially Integrated (12/30/2022)   Social Connection and Isolation Panel    Frequency of Communication with Friends and Family: More than three times a week    Frequency of Social Gatherings with Friends and Family: Twice a week    Attends Religious Services:  More than 4 times per year    Active Member of [employer] or Organizations: Yes    Attends Banker Meetings: 1 to 4 times per year    Marital Status: Married  Catering manager Violence: Not on file    Family History  Problem Relation Age of Onset   Hypertension Father    Colon cancer Father 48   Esophageal cancer Father        and gum cancer, smoker   Colon polyps Paternal Aunt    Lung cancer Paternal Uncle    Seizures Paternal Uncle    Hypertension Maternal Grandmother    Thyroid  disease Paternal Grandmother    Bone cancer Paternal Grandfather    Seizures Paternal Grandfather    Asthma Daughter    Asthma Son    Rectal cancer Neg Hx    Stomach cancer Neg Hx      Review of Systems  Constitutional: Negative.  Negative for chills and fever.  HENT: Negative.  Negative for congestion and sore throat.   Respiratory: Negative.  Negative for cough and shortness of breath.   Cardiovascular:  Positive for chest pain. Negative for palpitations.  Gastrointestinal:  Negative for abdominal pain, diarrhea, nausea and vomiting.  Genitourinary: Negative.  Negative for dysuria and hematuria.  Musculoskeletal:  Positive for back pain.  Skin: Negative.  Negative for rash.  Neurological: Negative.  Negative for dizziness and headaches.  All other systems reviewed and are negative.   Vitals:   09/07/23 1307  BP: (!) 162/100  Pulse: 73  Temp: 98.3 F (36.8 C)  SpO2: 97%    Physical Exam Vitals reviewed.  Constitutional:      Appearance: Normal appearance.  HENT:     Head: Normocephalic.     Mouth/Throat:     Mouth: Mucous membranes are moist.     Pharynx: Oropharynx is clear.  Eyes:     Extraocular Movements: Extraocular movements intact.     Pupils: Pupils are equal, round, and reactive to light.  Cardiovascular:     Rate and Rhythm: Normal rate and regular rhythm.     Pulses: Normal pulses.     Heart sounds: Normal heart sounds.  Pulmonary:     Effort: Pulmonary  effort is normal.     Breath sounds: Normal breath sounds.  Musculoskeletal:     Cervical back: No tenderness.     Right lower leg: No edema.     Left lower leg: No edema.  Lymphadenopathy:     Cervical: No cervical adenopathy.  Skin:    General: Skin is warm and dry.  Neurological:     General: No focal deficit present.     Mental Status: She is alert and oriented to person, place,  and time.  Psychiatric:        Mood and Affect: Mood normal.        Behavior: Behavior normal.     EKG: Normal sinus rhythm with ventricular rate of 65/min.  No acute ischemic changes.  Normal EKG.  DG Chest 2 View Result Date: 09/07/2023 CLINICAL DATA:  Chest pain. EXAM: CHEST - 2 VIEW COMPARISON:  Chest radiograph dated 12/17/2021. FINDINGS: No focal consolidation, pleural effusion, or pneumothorax. The cardiac silhouette is within normal limits. No acute osseous pathology. IMPRESSION: No active cardiopulmonary disease. Electronically Signed   By: Vanetta Chou M.D.   On: 09/07/2023 14:14     ASSESSMENT & PLAN: A total of 42 minutes was spent with the patient and counseling/coordination of care regarding preparing for this visit, review of most recent office visit notes, review of multiple chronic medical conditions and their management, differential diagnosis of chest pain, review of chest x-ray images done today, symptom management, review of all medications, review of most recent bloodwork results, review of health maintenance items, education on nutrition, prognosis, documentation, and need for follow up.   Problem List Items Addressed This Visit       Other   Nonspecific chest pain - Primary   Clinically stable.  No red flag signs or symptoms. CT cardiac scoring test dated 05/03/2022 has score of 0.  Heart is normal in size. Recent blood work from 06/20/2023 shows normal labs except for low vitamin B12 level EKG today within normal limits.  No acute ischemic changes Unremarkable physical  examination Chest x-ray today normal Very low suspicion for cardiac event ED precautions given Recommend follow-up with PCP and cardiologist      Relevant Orders   DG Chest 2 View (Completed)   EKG 12-Lead   Patient Instructions  Nonspecific Chest Pain Chest pain can be caused by many different conditions. Some causes of chest pain can be life-threatening. These will require treatment right away. Serious causes of chest pain include: Heart attack. A tear in the body's main blood vessel. Redness and swelling (inflammation) around your heart. Blood clot in your lungs. Other causes of chest pain may not be so serious. These include: Heartburn. Anxiety or stress. Damage to bones or muscles in your chest. Lung infections. Chest pain can feel like: Pain or discomfort in your chest. Crushing, pressure, aching, or squeezing pain. Burning or tingling. Dull or sharp pain that is worse when you move, cough, or take a deep breath. Pain or discomfort that is also felt in your back, neck, jaw, shoulder, or arm, or pain that spreads to any of these areas. It is hard to know whether your pain is caused by something that is serious or something that is not so serious. So it is important to see your doctor right away if you have chest pain. Follow these instructions at home: Medicines Take over-the-counter and prescription medicines only as told by your doctor. If you were prescribed an antibiotic medicine, take it as told by your doctor. Do not stop taking the antibiotic even if you start to feel better. Lifestyle  Rest as told by your doctor. Do not use any products that contain nicotine or tobacco, such as cigarettes, e-cigarettes, and chewing tobacco. If you need help quitting, ask your doctor. Do not drink alcohol. Make lifestyle changes as told by your doctor. These may include: Getting regular exercise. Ask your doctor what activities are safe for you. Eating a heart-healthy diet. A  diet and nutrition specialist (  dietitian) can help you to learn healthy eating options. Staying at a healthy weight. Treating diabetes or high blood pressure, if needed. Lowering your stress. Activities such as yoga and relaxation techniques can help. General instructions Pay attention to any changes in your symptoms. Tell your doctor about them or any new symptoms. Avoid any activities that cause chest pain. Keep all follow-up visits as told by your doctor. This is important. You may need more testing if your chest pain does not go away. Contact a doctor if: Your chest pain does not go away. You feel depressed. You have a fever. Get help right away if: Your chest pain is worse. You have a cough that gets worse, or you cough up blood. You have very bad (severe) pain in your belly (abdomen). You pass out (faint). You have either of these for no clear reason: Sudden chest discomfort. Sudden discomfort in your arms, back, neck, or jaw. You have shortness of breath at any time. You suddenly start to sweat, or your skin gets clammy. You feel sick to your stomach (nauseous). You throw up (vomit). You suddenly feel lightheaded or dizzy. You feel very weak or tired. Your heart starts to beat fast, or it feels like it is skipping beats. These symptoms may be an emergency. Do not wait to see if the symptoms will go away. Get medical help right away. Call your local emergency services (911 in the [place] Do not drive yourself to the hospital. Summary Chest pain can be caused by many different conditions. The cause may be serious and need treatment right away. If you have chest pain, see your doctor right away. Follow your doctor's instructions for taking medicines and making lifestyle changes. Keep all follow-up visits as told by your doctor. This includes visits for any further testing if your chest pain does not go away. Be sure to know the signs that show that your condition has become worse.  Get help right away if you have these symptoms. This information is not intended to replace advice given to you by your health care provider. Make sure you discuss any questions you have with your health care provider. Document Revised: 11/11/2021 Document Reviewed: 11/11/2021 Elsevier Patient Education  2024 Elsevier Inc.     Emil Schaumann, MD [facility]

## 2023-09-08 ENCOUNTER — Ambulatory Visit: Payer: Self-pay | Admitting: Emergency Medicine

## 2023-09-15 ENCOUNTER — Encounter (HOSPITAL_BASED_OUTPATIENT_CLINIC_OR_DEPARTMENT_OTHER): Payer: Self-pay | Admitting: Urology

## 2023-09-15 ENCOUNTER — Other Ambulatory Visit: Payer: Self-pay

## 2023-09-15 ENCOUNTER — Emergency Department (HOSPITAL_BASED_OUTPATIENT_CLINIC_OR_DEPARTMENT_OTHER)
Admission: EM | Admit: 2023-09-15 | Discharge: 2023-09-15 | Disposition: A | Discharge status: Home / Self Care | Attending: Emergency Medicine | Admitting: Emergency Medicine

## 2023-09-15 ENCOUNTER — Emergency Department (HOSPITAL_BASED_OUTPATIENT_CLINIC_OR_DEPARTMENT_OTHER)

## 2023-09-15 DIAGNOSIS — R0789 Other chest pain: Secondary | ICD-10-CM | POA: Insufficient documentation

## 2023-09-15 DIAGNOSIS — R519 Headache, unspecified: Secondary | ICD-10-CM | POA: Insufficient documentation

## 2023-09-15 DIAGNOSIS — R110 Nausea: Secondary | ICD-10-CM | POA: Diagnosis not present

## 2023-09-15 DIAGNOSIS — F419 Anxiety disorder, unspecified: Secondary | ICD-10-CM | POA: Insufficient documentation

## 2023-09-15 DIAGNOSIS — R42 Dizziness and giddiness: Secondary | ICD-10-CM | POA: Insufficient documentation

## 2023-09-15 DIAGNOSIS — Z79899 Other long term (current) drug therapy: Secondary | ICD-10-CM | POA: Insufficient documentation

## 2023-09-15 LAB — CBC
HCT: 40.6 % (ref 36.0–46.0)
Hemoglobin: 14.3 g/dL (ref 12.0–15.0)
MCH: 31.3 pg (ref 26.0–34.0)
MCHC: 35.2 g/dL (ref 30.0–36.0)
MCV: 88.8 fL (ref 80.0–100.0)
Platelets: 264 K/uL (ref 150–400)
RBC: 4.57 MIL/uL (ref 3.87–5.11)
RDW: 12.3 % (ref 11.5–15.5)
WBC: 9.3 K/uL (ref 4.0–10.5)
nRBC: 0 % (ref 0.0–0.2)

## 2023-09-15 LAB — TROPONIN T, HIGH SENSITIVITY
Troponin T High Sensitivity: <15 ng/L (ref 0–19)
Troponin T High Sensitivity: <15 ng/L (ref 0–19)

## 2023-09-15 LAB — BASIC METABOLIC PANEL WITH GFR
Anion gap: 14 (ref 5–15)
BUN: 8 mg/dL (ref 6–20)
CO2: 24 mmol/L (ref 22–32)
Calcium: 9.7 mg/dL (ref 8.9–10.3)
Chloride: 100 mmol/L (ref 98–111)
Creatinine, Ser: 0.83 mg/dL (ref 0.44–1.00)
GFR, Estimated: >60 mL/min (ref >60)
Glucose, Bld: 104 mg/dL — ABNORMAL HIGH (ref 70–99)
Potassium: 3.4 mmol/L — ABNORMAL LOW (ref 3.5–5.1)
Sodium: 137 mmol/L (ref 135–145)

## 2023-09-15 MED ORDER — LIDOCAINE 5 % EX PTCH
1.0000 | MEDICATED_PATCH | CUTANEOUS | Status: DC
  Administered 2023-09-15: 1 via TRANSDERMAL
  Filled 2023-09-15: qty 1

## 2023-09-15 MED ORDER — NAPROXEN 500 MG PO TABS: 250.0000 mg | ORAL_TABLET | Freq: Two times a day (BID) | ORAL | 0 refills | Status: AC

## 2023-09-15 MED ORDER — IBUPROFEN 800 MG PO TABS
800.0000 mg | ORAL_TABLET | Freq: Once | ORAL | Status: AC
  Administered 2023-09-15: 800 mg via ORAL
  Filled 2023-09-15: qty 1

## 2023-09-15 MED ORDER — PROCHLORPERAZINE MALEATE 10 MG PO TABS: 5.0000 mg | ORAL_TABLET | Freq: Two times a day (BID) | ORAL | 0 refills | Status: AC | PRN

## 2023-09-15 NOTE — ED Provider Notes (Signed)
 [place] [facility] DEPARTMENT AT [facility] Provider Note   CSN: [id number] Arrival date & time: 09/15/23  1624     Patient presents with: Chest Pain and Headache   Leslie Beard is a 48 y.o. female who  has a past medical history of Migraine.who presents for chest pain .  Patient reports that she has had intermittent left-sided chest pain which is sometimes aching and sometimes pinching.  She states that symptoms have been coming and going, they are not related to exertion.  She has no shortness of breath pleuritic chest pain diaphoresis, nausea or vomiting.  Patient reports that symptoms have become more aching and movement have been lasting longer over the last 24 hours today.  Today she she had symptoms lasting almost 3 hours described as a dull ache under the left breast.  This brought her to the emergency department.  Patient also reports that she has a past medical history of migraine headaches.  Patient states that she does not particularly care to take medicine regularly she did take a single Tylenol today and her headache has improved but she has unilateral throbbing headache with pressure in the left side of her face.  No vision changes.  She had some mild nausea earlier and a little bit of mild dizziness which have also improved.  Patient denies history of hypertension, hyperlipidemia, diabetes, primarily at relative with a history of MI or CAD and she is a non-smoker.  Further patient denies any recent trauma, confinement, surgery, hemoptysis, use of exogenous estrogens, unilateral leg swelling or history of PE or DVT.     Chest Pain Associated symptoms: headache   Headache      Prior to Admission medications   Medication Sig Start Date End Date Taking? Authorizing Provider  Cholecalciferol (VITAMIN D3) 50 MCG (2000 UT) capsule Take 1 capsule (2,000 Units total) by mouth daily. 07/05/23   Plotnikov, Aleksei V, MD  Methylcobalamin (B12) 5000 MCG SUBL Place 5,000 mcg  under the tongue daily. 07/05/23   Plotnikov, Aleksei V, MD  Vitamin D , Ergocalciferol , (DRISDOL ) 1.25 MG (50000 UNIT) CAPS capsule Take 1 capsule (50,000 Units total) by mouth every 7 (seven) days. 07/05/23   Plotnikov, Aleksei V, MD    Allergies: Sulfa antibiotics    Review of Systems  Cardiovascular:  Positive for chest pain.  Neurological:  Positive for headaches.    Updated Vital Signs BP (!) 151/92   Pulse 92   Temp 98.2 F (36.8 C)   Resp 19   Ht 5' (1.524 m)   Wt 78.4 kg   LMP 06/01/2017   SpO2 100%   BMI 33.76 kg/m   Physical Exam Vitals and nursing note reviewed.  Constitutional:      General: She is not in acute distress.    Appearance: She is well-developed. She is not diaphoretic.  HENT:     Head: Normocephalic and atraumatic.     Right Ear: External ear normal.     Left Ear: External ear normal.     Nose: Nose normal.     Mouth/Throat:     Mouth: Mucous membranes are moist.  Eyes:     General: No scleral icterus.    Conjunctiva/sclera: Conjunctivae normal.  Cardiovascular:     Rate and Rhythm: Normal rate and regular rhythm.     Heart sounds: Normal heart sounds. No murmur heard.    No friction rub. No gallop.  Pulmonary:     Effort: Pulmonary effort is normal. No  respiratory distress.     Breath sounds: Normal breath sounds.  Abdominal:     General: Bowel sounds are normal. There is no distension.     Palpations: Abdomen is soft. There is no mass.     Tenderness: There is no abdominal tenderness. There is no guarding.  Musculoskeletal:     Cervical back: Normal range of motion.  Skin:    General: Skin is warm and dry.  Neurological:     Mental Status: She is alert and oriented to person, place, and time.     Cranial Nerves: No cranial nerve deficit.     Motor: No weakness.  Psychiatric:        Mood and Affect: Mood is anxious.        Behavior: Behavior normal.     (all labs ordered are listed, but only abnormal results are displayed) Labs  Reviewed  BASIC METABOLIC PANEL WITH GFR - Abnormal; Notable for the following components:      Result Value   Potassium 3.4 (*)    Glucose, Bld 104 (*)    All other components within normal limits  CBC  TROPONIN T, HIGH SENSITIVITY  TROPONIN T, HIGH SENSITIVITY    EKG: EKG Interpretation Date/Time:  Friday September 15 2023 16:31:12 EDT Ventricular Rate:  97 PR Interval:  135 QRS Duration:  82 QT Interval:  332 QTC Calculation: 422 R Axis:   58  Text Interpretation: Sinus rhythm Borderline repolarization abnormality Confirmed by Darra Chew [phone]) on 09/15/2023 4:39:59 PM  Radiology: [facility] Chest 2 View Result Date: 09/15/2023 CLINICAL DATA:  Dull chest pain for 1 week, headaches EXAM: CHEST - 2 VIEW COMPARISON:  09/07/2023 FINDINGS: The heart size and mediastinal contours are within normal limits. Both lungs are clear. The visualized skeletal structures are unremarkable. IMPRESSION: No active cardiopulmonary disease. Electronically Signed   By: Ozell Daring M.D.   On: 09/15/2023 16:56     Procedures   Medications Ordered in the ED - No data to display                                  Medical Decision Making Given the large differential diagnosis for Rozella P Artz, the decision making in this case is of high complexity.  After evaluating all of the data points in this case, the presentation of Sarra P Ahn is NOT consistent with Acute Coronary Syndrome (ACS) and/or myocardial ischemia, pulmonary embolism, aortic dissection; Borhaave's, significant arrythmia, pneumothorax, cardiac tamponade, or other emergent cardiopulmonary condition.  Further, the presentation of Annalysse P Chesnut is NOT consistent with pericarditis, myocarditis, cholecystitis, pancreatitis, mediastinitis, endocarditis, new valvular disease.  Additionally, the presentation of Marionette P Jonesis NOT consistent with flail chest, cardiac contusion, ARDS, or significant intra-thoracic or intra-abdominal  bleeding.  Moreover, this presentation is NOT consistent with pneumonia, sepsis, or pyelonephritis.  The patient has a   heart score 1, PERC negative   Patient's headache seems to be consistent with migraine.  She is declining any medication for headache at this time has a history of the same but does not like to take medications.  She is agreed to discharge with naproxen  and Compazine  for treatment of headache and close outpatient follow-up with her PCP.  Strict return and follow-up precautions have been given by me personally or by detailed written instruction given verbally by nursing staff using the teach back method to the patient/family/caregiver(s).  Data Reviewed/Counseling: I  have reviewed the patient's vital signs, nursing notes, and other relevant tests/information. I had a detailed discussion regarding the historical points, exam findings, and any diagnostic results supporting the discharge diagnosis. I also discussed the need for outpatient follow-up and the need to return to the ED if symptoms worsen or if there are any questions or concerns that arise at home.    Amount and/or Complexity of Data Reviewed Labs: ordered.    Details: 2 negative troponins, labs otherwise within normal limits Radiology: ordered.    Details: 2 view chest x-ray which shows no acute findings ECG/medicine tests: ordered and independent interpretation performed. Discussion of management or test interpretation with external provider(s): Sinus rhythm at a rate of 97 with borderline repull abnormality no other acute findings       Risk Prescription drug management.        Final diagnoses:  Atypical chest pain  Bad headache    ED Discharge Orders     None          Arloa Chroman, PA-C 09/17/23 0013    Darra Fonda MATSU, MD 09/18/23 [phone]

## 2023-09-15 NOTE — ED Triage Notes (Signed)
 Pt states dull chest pain x 1 week  Seen at pcp last week for same  States headaches all week and chest pain is continued

## 2023-09-15 NOTE — Discharge Instructions (Addendum)
 ### Chest Wall Pain Information     **What Is Chest Wall Pain?**      Chest wall pain is discomfort that comes from the muscles, bones, or joints in your chest, rather than your heart or lungs. It can feel like pinching, aching, or soreness. This type of pain is often not related to exercise or activity, and may come and go.      **Your Recent Evaluation**      Based on your symptoms and test results:      - Two blood tests for heart damage (troponin) were negative.      - Your risk for blood clots in the lungs (pulmonary embolism) is very low.  (< 2%)     - Your heart tracing (ECG) and chest X-ray were normal.      - All other lab results were normal.      - Your overall risk for a serious heart problem is extremely low.      These findings mean that dangerous causes of chest pain, like heart attack or blood clots, have been ruled out.[1][2][3]      **What Could Be Causing Your Pain?**      When serious problems are ruled out, chest pain is often due to:      - **Muscle or joint strain**: This is the most common cause, especially if the pain is sharp or worsens with movement or touch.      - **Gastrointestinal issues**: Problems like acid reflux can cause chest discomfort.      - **Stress or anxiety**: Emotional stress can sometimes cause chest pain, even if your heart is healthy.[1][4][5][6]      **What Should You Do Next?**      - **Rest and gentle movement**: Most chest wall pain gets better with time and simple pain relief, like acetaminophen or ibuprofen  (if safe for you).      - **Monitor your symptoms**: If your pain changes, becomes severe, or you develop new symptoms like trouble breathing, fainting, or sweating, seek medical attention right away.      - **Consider stress management**: If you notice your pain is related to stress or anxiety, talking to a counselor or trying relaxation techniques may help. Studies show that therapy can reduce chest pain  episodes.[1][6][7]      - **Gastrointestinal symptoms**: If you have heartburn or stomach discomfort, a trial of antacid medication may be helpful. Your doctor can guide you on this.[4][5][8]      **What Is Not Needed Right Now?**      Because your tests show you are at very low risk, you do not need more heart tests or hospital admission at this time.[1][2][3] Most people with similar results do very well.      **When to Call Your Doctor**      Contact your doctor if:      - The pain gets much worse or lasts longer than usual.      - You develop new symptoms like shortness of breath, dizziness, or sweating.      - You have questions or concerns about your pain.      **Summary**      Your chest pain is not caused by a heart attack or other dangerous condition. It is most likely from the chest wall, stress, or stomach issues. Most people recover quickly and do not need further testing. If you have any new or concerning symptoms, let your doctor know.[1][9][4][2][5][8][10][3][6][7]  Last - you may try over the counter lidocaine  patch for pain. I am also discharging you with a few tablets of compazine  and naproxen  to try in combination for bad headache.    SEEK MEDICAL ATTENTION IF: You develop possible problems with medications prescribed.  The medications don't resolve your headache, if it recurs , or if you have multiple episodes of vomiting or can't take fluids. You have a change from the usual headache. RETURN IMMEDIATELY IF you develop a sudden, severe headache or confusion, become poorly responsive or faint, develop a fever above 100.31F or problem breathing, have a change in speech, vision, swallowing, or understanding, or develop new weakness, numbness, tingling, incoordination, or have a seizure.

## 2023-09-15 NOTE — ED Notes (Signed)
 Patient transported to X-ray

## 2023-11-29 ENCOUNTER — Other Ambulatory Visit: Payer: Self-pay | Admitting: Obstetrics and Gynecology

## 2023-11-29 DIAGNOSIS — N6321 Unspecified lump in the left breast, upper outer quadrant: Secondary | ICD-10-CM

## 2024-01-15 ENCOUNTER — Encounter

## 2024-01-15 ENCOUNTER — Other Ambulatory Visit

## 2024-01-15 DIAGNOSIS — N6321 Unspecified lump in the left breast, upper outer quadrant: Secondary | ICD-10-CM
